# Patient Record
Sex: Female | Born: 1985 | Race: Black or African American | Hispanic: No | Marital: Married | Smoking: Former smoker
Health system: Southern US, Community
[De-identification: ages and names within clinical notes are randomized; demographics above are authoritative.]

## PROBLEM LIST (undated history)

## (undated) DIAGNOSIS — O09299 Supervision of pregnancy with other poor reproductive or obstetric history, unspecified trimester: Secondary | ICD-10-CM

## (undated) DIAGNOSIS — N83209 Unspecified ovarian cyst, unspecified side: Secondary | ICD-10-CM

## (undated) DIAGNOSIS — O99210 Obesity complicating pregnancy, unspecified trimester: Secondary | ICD-10-CM

## (undated) DIAGNOSIS — E119 Type 2 diabetes mellitus without complications: Secondary | ICD-10-CM

## (undated) DIAGNOSIS — T7840XA Allergy, unspecified, initial encounter: Secondary | ICD-10-CM

## (undated) HISTORY — DX: Allergy, unspecified, initial encounter: T78.40XA

## (undated) HISTORY — PX: OTHER SURGICAL HISTORY: SHX169

## (undated) HISTORY — DX: Supervision of pregnancy with other poor reproductive or obstetric history, unspecified trimester: O09.299

---

## 2004-12-31 ENCOUNTER — Emergency Department: Payer: Self-pay | Admitting: Emergency Medicine

## 2005-01-01 ENCOUNTER — Ambulatory Visit: Payer: Self-pay | Admitting: Emergency Medicine

## 2005-02-27 ENCOUNTER — Inpatient Hospital Stay: Payer: Self-pay | Admitting: Unknown Physician Specialty

## 2005-07-17 ENCOUNTER — Emergency Department: Payer: Self-pay | Admitting: Emergency Medicine

## 2005-09-03 HISTORY — PX: OVARIAN CYST SURGERY: SHX726

## 2005-09-28 ENCOUNTER — Ambulatory Visit: Payer: Self-pay | Admitting: Psychiatry

## 2006-02-21 ENCOUNTER — Emergency Department: Payer: Self-pay | Admitting: Emergency Medicine

## 2006-03-13 ENCOUNTER — Ambulatory Visit: Payer: Self-pay | Admitting: Obstetrics & Gynecology

## 2006-03-22 ENCOUNTER — Emergency Department: Payer: Self-pay | Admitting: Emergency Medicine

## 2006-03-22 ENCOUNTER — Other Ambulatory Visit: Payer: Self-pay

## 2006-05-22 ENCOUNTER — Ambulatory Visit: Payer: Self-pay | Admitting: Obstetrics & Gynecology

## 2006-06-03 ENCOUNTER — Ambulatory Visit: Payer: Self-pay | Admitting: Obstetrics & Gynecology

## 2006-06-10 ENCOUNTER — Encounter: Payer: Self-pay | Admitting: Maternal & Fetal Medicine

## 2006-06-17 ENCOUNTER — Encounter: Payer: Self-pay | Admitting: Maternal & Fetal Medicine

## 2006-06-24 ENCOUNTER — Encounter: Payer: Self-pay | Admitting: Maternal & Fetal Medicine

## 2006-07-01 ENCOUNTER — Encounter: Payer: Self-pay | Admitting: Maternal & Fetal Medicine

## 2006-07-04 ENCOUNTER — Ambulatory Visit: Payer: Self-pay | Admitting: Obstetrics & Gynecology

## 2006-07-08 ENCOUNTER — Encounter: Payer: Self-pay | Admitting: Maternal & Fetal Medicine

## 2006-07-15 ENCOUNTER — Encounter: Payer: Self-pay | Admitting: Maternal & Fetal Medicine

## 2006-07-22 ENCOUNTER — Encounter: Payer: Self-pay | Admitting: Maternal & Fetal Medicine

## 2006-07-26 ENCOUNTER — Observation Stay: Payer: Self-pay

## 2006-07-27 ENCOUNTER — Observation Stay: Payer: Self-pay

## 2006-07-29 ENCOUNTER — Observation Stay: Payer: Self-pay

## 2006-07-30 ENCOUNTER — Encounter: Payer: Self-pay | Admitting: Maternal & Fetal Medicine

## 2006-08-02 ENCOUNTER — Inpatient Hospital Stay: Payer: Self-pay | Admitting: Unknown Physician Specialty

## 2006-08-03 ENCOUNTER — Ambulatory Visit: Payer: Self-pay | Admitting: Obstetrics & Gynecology

## 2006-08-04 DIAGNOSIS — O24424 Gestational diabetes mellitus in childbirth, insulin controlled: Secondary | ICD-10-CM

## 2006-09-28 ENCOUNTER — Emergency Department: Payer: Self-pay | Admitting: Emergency Medicine

## 2008-02-03 ENCOUNTER — Ambulatory Visit: Payer: Self-pay

## 2008-02-24 ENCOUNTER — Observation Stay: Payer: Self-pay | Admitting: Unknown Physician Specialty

## 2008-03-03 ENCOUNTER — Ambulatory Visit: Payer: Self-pay

## 2008-03-04 ENCOUNTER — Observation Stay: Payer: Self-pay

## 2008-03-15 ENCOUNTER — Observation Stay: Payer: Self-pay | Admitting: Obstetrics and Gynecology

## 2008-03-16 ENCOUNTER — Observation Stay: Payer: Self-pay | Admitting: Obstetrics & Gynecology

## 2008-03-17 ENCOUNTER — Inpatient Hospital Stay: Payer: Self-pay | Admitting: Obstetrics & Gynecology

## 2009-09-24 ENCOUNTER — Emergency Department: Payer: Self-pay | Admitting: Emergency Medicine

## 2010-07-12 ENCOUNTER — Emergency Department: Payer: Self-pay | Admitting: Emergency Medicine

## 2010-10-20 ENCOUNTER — Emergency Department: Payer: Self-pay | Admitting: Emergency Medicine

## 2010-10-22 ENCOUNTER — Emergency Department: Payer: Self-pay | Admitting: Emergency Medicine

## 2013-06-14 ENCOUNTER — Emergency Department: Payer: Self-pay | Admitting: Emergency Medicine

## 2013-06-14 LAB — CBC
HCT: 37.1 % (ref 35.0–47.0)
HGB: 13.1 g/dL (ref 12.0–16.0)
MCH: 30.8 pg (ref 26.0–34.0)
MCV: 87 fL (ref 80–100)
Platelet: 297 10*3/uL (ref 150–440)
RBC: 4.25 10*6/uL (ref 3.80–5.20)
RDW: 12.9 % (ref 11.5–14.5)
WBC: 7.1 10*3/uL (ref 3.6–11.0)

## 2013-06-14 LAB — BASIC METABOLIC PANEL
Anion Gap: 6 — ABNORMAL LOW (ref 7–16)
BUN: 11 mg/dL (ref 7–18)
Chloride: 105 mmol/L (ref 98–107)
Co2: 28 mmol/L (ref 21–32)
Creatinine: 0.93 mg/dL (ref 0.60–1.30)
EGFR (African American): >60
Glucose: 97 mg/dL (ref 65–99)
Osmolality: 277 (ref 275–301)
Potassium: 3.8 mmol/L (ref 3.5–5.1)
Sodium: 139 mmol/L (ref 136–145)

## 2013-06-14 LAB — MAGNESIUM: Magnesium: 1.7 mg/dL — ABNORMAL LOW

## 2013-06-17 ENCOUNTER — Emergency Department: Payer: Self-pay | Admitting: Emergency Medicine

## 2014-09-03 DIAGNOSIS — O99210 Obesity complicating pregnancy, unspecified trimester: Secondary | ICD-10-CM

## 2014-09-03 HISTORY — DX: Obesity complicating pregnancy, unspecified trimester: O99.210

## 2015-03-25 ENCOUNTER — Emergency Department
Admission: EM | Admit: 2015-03-25 | Discharge: 2015-03-25 | Disposition: A | Discharge status: Home / Self Care | Payer: BLUE CROSS/BLUE SHIELD | Attending: Emergency Medicine | Admitting: Emergency Medicine

## 2015-03-25 ENCOUNTER — Emergency Department: Payer: BLUE CROSS/BLUE SHIELD

## 2015-03-25 DIAGNOSIS — Z3490 Encounter for supervision of normal pregnancy, unspecified, unspecified trimester: Secondary | ICD-10-CM

## 2015-03-25 DIAGNOSIS — Z331 Pregnant state, incidental: Secondary | ICD-10-CM | POA: Insufficient documentation

## 2015-03-25 DIAGNOSIS — R109 Unspecified abdominal pain: Secondary | ICD-10-CM | POA: Insufficient documentation

## 2015-03-25 DIAGNOSIS — M545 Low back pain: Secondary | ICD-10-CM | POA: Diagnosis present

## 2015-03-25 LAB — URINALYSIS COMPLETE WITH MICROSCOPIC (ARMC ONLY)
BILIRUBIN URINE: NEGATIVE
GLUCOSE, UA: NEGATIVE mg/dL
Hgb urine dipstick: NEGATIVE
Ketones, ur: NEGATIVE mg/dL
LEUKOCYTES UA: NEGATIVE
NITRITE: NEGATIVE
PROTEIN: NEGATIVE mg/dL
RBC / HPF: NONE SEEN RBC/hpf (ref 0–5)
Specific Gravity, Urine: 1.023 (ref 1.005–1.030)
pH: 5 (ref 5.0–8.0)

## 2015-03-25 LAB — CBC WITH DIFFERENTIAL/PLATELET
Basophils Absolute: 0 10*3/uL (ref 0–0.1)
Basophils Relative: 0 %
EOS ABS: 0.1 10*3/uL (ref 0–0.7)
Eosinophils Relative: 1 %
HEMATOCRIT: 37.6 % (ref 35.0–47.0)
Hemoglobin: 12.8 g/dL (ref 12.0–16.0)
LYMPHS PCT: 32 %
Lymphs Abs: 2.8 10*3/uL (ref 1.0–3.6)
MCH: 29.8 pg (ref 26.0–34.0)
MCHC: 33.9 g/dL (ref 32.0–36.0)
MCV: 87.9 fL (ref 80.0–100.0)
MONO ABS: 0.5 10*3/uL (ref 0.2–0.9)
Monocytes Relative: 6 %
NEUTROS ABS: 5.2 10*3/uL (ref 1.4–6.5)
NEUTROS PCT: 61 %
PLATELETS: 297 10*3/uL (ref 150–440)
RBC: 4.28 MIL/uL (ref 3.80–5.20)
RDW: 12.7 % (ref 11.5–14.5)
WBC: 8.6 10*3/uL (ref 3.6–11.0)

## 2015-03-25 LAB — POCT PREGNANCY, URINE: Preg Test, Ur: POSITIVE — AB

## 2015-03-25 LAB — HCG, QUANTITATIVE, PREGNANCY: hCG, Beta Chain, Quant, S: 146 m[IU]/mL — ABNORMAL HIGH (ref <5)

## 2015-03-25 LAB — BASIC METABOLIC PANEL
Anion gap: 7 (ref 5–15)
BUN: 13 mg/dL (ref 6–20)
CALCIUM: 9.4 mg/dL (ref 8.9–10.3)
CO2: 23 mmol/L (ref 22–32)
Chloride: 106 mmol/L (ref 101–111)
Creatinine, Ser: 0.9 mg/dL (ref 0.44–1.00)
GFR calc Af Amer: >60 mL/min (ref >60)
GFR calc non Af Amer: >60 mL/min (ref >60)
Glucose, Bld: 183 mg/dL — ABNORMAL HIGH (ref 65–99)
Potassium: 3.9 mmol/L (ref 3.5–5.1)
Sodium: 136 mmol/L (ref 135–145)

## 2015-03-25 NOTE — ED Notes (Signed)
Pt c/o lower back pain since Monday, denies injury.. 

## 2015-03-25 NOTE — ED Provider Notes (Signed)
[facility] Emergency Department Provider Note  ____________________________________________  Time seen:  11:24 AM  I have reviewed the triage vital signs and the nursing notes.   HISTORY  Chief Complaint Back Pain   HPI Kari Tanner is a 29 y.o. female is here with complaint of low back pain for approximately 2-3 days. She states now the pain is radiating around to her left lower abdomen. She denies any urinary symptoms. She does not have a history of kidney stones. She denies any injury. Nothing has helped her symptoms and movement has made it worse. She also states that she does not use birth control and last period was in June. She denies any vaginal discharge or vaginal bleeding at this time. Currently pain 8 out of 10. She is continue to ambulate but with pain. She denies any previous back problems.   No past medical history on file.  There are no active problems to display for this patient.   No past surgical history on file.  No current outpatient prescriptions on file.  Allergies Review of patient's allergies indicates no known allergies.  No family history on file.  Social History History  Substance Use Topics  . Smoking status: Not on file  . Smokeless tobacco: Not on file  . Alcohol Use: Not on file    Review of Systems Constitutional: No fever/chills Eyes: No visual changes. ENT: No sore throat. Cardiovascular: Denies chest pain. Respiratory: Denies shortness of breath. Gastrointestinal positive left abdominal pain.  No nausea, no vomiting.  No diarrhea.  No constipation. Genitourinary: Negative for dysuria. Musculoskeletal: Positive for back pain. Skin: Negative for rash. Neurological: Negative for headaches, focal weakness or numbness.  10-point ROS otherwise negative.  ____________________________________________   PHYSICAL EXAM:  VITAL SIGNS: ED Triage Vitals  Enc Vitals Group     BP 03/25/15 1100 127/81  mmHg     Pulse Rate 03/25/15 1100 86     Resp 03/25/15 1100 18     Temp 03/25/15 1100 98.6 F (37 C)     Temp Source 03/25/15 1100 Oral     SpO2 03/25/15 1100 99 %     Weight 03/25/15 1059 236 lb (107.049 kg)     Height 03/25/15 1059  (1.6 m)     Head Cir --      Peak Flow --      Pain Score 03/25/15 1059 8     Pain Loc --      Pain Edu? --      Excl. in GC? --     Constitutional: Alert and oriented. Well appearing and in no acute distress. Eyes: Conjunctivae are normal. PERRL. EOMI. Head: Atraumatic. Nose: No congestion/rhinnorhea. Neck: No stridor.   Cardiovascular: Normal rate, regular rhythm. Grossly normal heart sounds.  Good peripheral circulation. Respiratory: Normal respiratory effort.  No retractions. Lungs CTAB. Gastrointestinal: Soft, with left lower quadrant tenderness. Bowel sounds are present in 4 quadrants. No rebound tenderness noted. No distention. No abdominal bruits. No CVA tenderness. Musculoskeletal: Back exam no gross deformity was noted. Range of motion is restricted secondary to pain. There is tenderness on palpation of the left paravertebral muscles. No lower extremity tenderness nor edema.  No joint effusions. Neurologic:  Normal speech and language. No gross focal neurologic deficits are appreciated. No gait instability. Skin:  Skin is warm, dry and intact. No rash noted. Psychiatric: Mood and affect are normal. Speech and behavior are normal.  ____________________________________________   LABS (all labs ordered are listed,  but only abnormal results are displayed)  Labs Reviewed  URINALYSIS COMPLETEWITH MICROSCOPIC ([facility] ONLY) - Abnormal; Notable for the following:    Color, Urine YELLOW (*)    APPearance CLEAR (*)    Bacteria, UA RARE (*)    Squamous Epithelial / LPF 0-5 (*)    All other components within normal limits  BASIC METABOLIC PANEL - Abnormal; Notable for the following:    Glucose, Bld 183 (*)    All other components within  normal limits  HCG, QUANTITATIVE, PREGNANCY - Abnormal; Notable for the following:    hCG, Beta Chain, Quant, S 146 (*)    All other components within normal limits  POCT PREGNANCY, URINE - Abnormal; Notable for the following:    Preg Test, Ur POSITIVE (*)    All other components within normal limits  CBC WITH DIFFERENTIAL/PLATELET  CBC WITH DIFFERENTIAL/PLATELET  POC URINE PREG, ED      _RADIOLOGY  Vaginal ultrasound shows no intrauterine fluid collection or gestational sac per radiologist. No ectopic pregnancy seen ____________________________________________   PROCEDURES  Procedure(s) performed: None  Critical Care performed: No  ____________________________________________   INITIAL IMPRESSION / ASSESSMENT AND PLAN / ED COURSE  Pertinent labs & imaging results that were available during my care of the patient were reviewed by me and considered in my medical decision making (see chart for details).  Explained to both patient and mother that she will need repeat beta hCG in 3 days. She was given the name of the OB/GYN doctor on call. She is return to the emergency room if any severe worsening or urgent concerns. She also is to discontinue using ibuprofen. She may use ice or heat to her back. ____________________________________________   FINAL CLINICAL IMPRESSION(S) / ED DIAGNOSES  Final diagnoses:  Left sided abdominal pain  Pregnancy      Tommi Rumps, PA-C 03/25/15 1538  Tommi Rumps, PA-C 03/25/15 1544  Phineas Semen, MD 03/25/15 1553

## 2015-03-25 NOTE — Discharge Instructions (Signed)
CALL THE OFFICE OF THE OB/GYN THAT IS LISTED ON YOUR PAPERS FOR INSTRUCTIONS FOR BLOOD WORK ON Monday RETURN TO ER THIS WEEKEND IF ANY SEVERE WORSENING OF YOUR PAIN OR URGENT CONCERNS

## 2015-03-25 NOTE — ED Notes (Signed)
States she developed lower back pain 2-3 days ago pain is now in lower abd . Denies any injury or urinary sx's

## 2015-04-05 ENCOUNTER — Ambulatory Visit (INDEPENDENT_AMBULATORY_CARE_PROVIDER_SITE_OTHER): Payer: Medicaid Other | Admitting: Obstetrics and Gynecology

## 2015-04-05 VITALS — BP 123/77 | HR 89 | Wt 236.1 lb

## 2015-04-05 DIAGNOSIS — N926 Irregular menstruation, unspecified: Secondary | ICD-10-CM

## 2015-04-05 LAB — POCT URINE PREGNANCY: Preg Test, Ur: POSITIVE — AB

## 2015-04-05 NOTE — Progress Notes (Cosign Needed)
Pt is here for follow up from ER 03/25/2015, [place] was done no gestational sac was noted, correlation with beta HCG levels and follow up US was recommended in 7-10 days Pt denies any vaginal bleeding, discussed with pt about repeating lab and [place] for viability was set up for 04/08/2015, pt voiced understanding

## 2015-04-07 LAB — BETA HCG QUANT (REF LAB): hCG Quant: 9004 m[IU]/mL

## 2015-04-08 ENCOUNTER — Other Ambulatory Visit: Payer: Self-pay | Admitting: *Deleted

## 2015-04-08 ENCOUNTER — Ambulatory Visit: Payer: BLUE CROSS/BLUE SHIELD

## 2015-04-08 DIAGNOSIS — N926 Irregular menstruation, unspecified: Secondary | ICD-10-CM | POA: Diagnosis not present

## 2015-04-08 LAB — SPECIMEN STATUS REPORT

## 2015-04-08 LAB — BETA HCG QUANT (REF LAB): hCG Quant: 9122 m[IU]/mL

## 2015-04-14 ENCOUNTER — Other Ambulatory Visit: Payer: Self-pay | Admitting: *Deleted

## 2015-04-14 ENCOUNTER — Telehealth: Payer: Self-pay | Admitting: Obstetrics and Gynecology

## 2015-04-14 MED ORDER — ONDANSETRON 4 MG PO TBDP: 4.0000 mg | ORAL_TABLET | Freq: Three times a day (TID) | ORAL | Status: DC | PRN

## 2015-04-14 NOTE — Telephone Encounter (Signed)
Notified pt to call if needs medication sent to pharmacy

## 2015-04-14 NOTE — Telephone Encounter (Signed)
Rx sent to pharmacy   

## 2015-04-14 NOTE — Telephone Encounter (Signed)
Patient called. She wants to know if she can give herself a shot of zofran for nausea. She is [redacted] weeks pregnant.

## 2015-04-14 NOTE — Telephone Encounter (Signed)
Pt wants the RX sent to pharmacy  [facility]

## 2015-04-14 NOTE — Telephone Encounter (Signed)
pls advise

## 2015-04-14 NOTE — Telephone Encounter (Signed)
That is safe, does she need a prescription for the ODT zofran to take at home?

## 2015-04-21 ENCOUNTER — Other Ambulatory Visit: Payer: Self-pay | Admitting: Obstetrics and Gynecology

## 2015-04-21 DIAGNOSIS — N926 Irregular menstruation, unspecified: Secondary | ICD-10-CM

## 2015-04-22 ENCOUNTER — Ambulatory Visit (INDEPENDENT_AMBULATORY_CARE_PROVIDER_SITE_OTHER): Payer: BLUE CROSS/BLUE SHIELD | Admitting: Obstetrics and Gynecology

## 2015-04-22 ENCOUNTER — Ambulatory Visit: Payer: BLUE CROSS/BLUE SHIELD

## 2015-04-22 ENCOUNTER — Encounter: Payer: Self-pay | Admitting: Obstetrics and Gynecology

## 2015-04-22 VITALS — BP 128/84 | Wt 245.5 lb

## 2015-04-22 DIAGNOSIS — N926 Irregular menstruation, unspecified: Secondary | ICD-10-CM | POA: Diagnosis not present

## 2015-04-22 DIAGNOSIS — E663 Overweight: Secondary | ICD-10-CM

## 2015-04-22 DIAGNOSIS — Z9889 Other specified postprocedural states: Secondary | ICD-10-CM

## 2015-04-22 DIAGNOSIS — Z98891 History of uterine scar from previous surgery: Secondary | ICD-10-CM

## 2015-04-22 DIAGNOSIS — Z3491 Encounter for supervision of normal pregnancy, unspecified, first trimester: Secondary | ICD-10-CM

## 2015-04-22 NOTE — Progress Notes (Cosign Needed)
NOB nurse intake, pt had viability done, pt was given OB packet, we discussed the [place] virus as [place] been [place] of the [place].  We discussed the 1st trimester screening info and she was given pamphlet on Panaroma, advised [facility] would discuss at her next office visit

## 2015-04-22 NOTE — Progress Notes (Signed)
Indications:repeat dating and viability Findings:  Singleton intrauterine pregnancy is visualized with a CRL consistent with 8 1/[redacted] weeks gestation, giving an (U/S) EDD of 12/01/2015. The (U/S) EDD is consistent with the clinically established (LMP) EDD of 12/03/2015.  FHR: 168 bpm CRL measurement: 17.2 mm Yolk sac and and early anatomy is normal.  Right Ovary measures 3.0 x 2.2 x 2.5 cm. It is normal in appearance. Left Ovary measures 4.6 x 3.2 x 3.3 cm. It is normal appearance. There is evidence of a corpus luteal cyst in the Left Survey of the adnexa demonstrates no adnexal masses. There is no free peritoneal fluid in the cul de sac.  Impression: 1. 8 1/7 week Viable Singleton Intrauterine pregnancy by U/S. 2. (U/S) EDD is consistent with Clinically established (LMP) EDD of 12/03/2015.

## 2015-04-23 LAB — URINALYSIS, ROUTINE W REFLEX MICROSCOPIC
Bilirubin, UA: NEGATIVE
Leukocytes, UA: NEGATIVE
NITRITE UA: NEGATIVE
Protein, UA: NEGATIVE
RBC, UA: NEGATIVE
SPEC GRAV UA: 1.03 (ref 1.005–1.030)
UUROB: 1 mg/dL (ref 0.2–1.0)
pH, UA: 6.5 (ref 5.0–7.5)

## 2015-04-23 LAB — CBC WITH DIFFERENTIAL/PLATELET
BASOS ABS: 0 10*3/uL (ref 0.0–0.2)
Basos: 0 %
EOS (ABSOLUTE): 0.1 10*3/uL (ref 0.0–0.4)
Eos: 1 %
Hematocrit: 36.5 % (ref 34.0–46.6)
Hemoglobin: 12 g/dL (ref 11.1–15.9)
IMMATURE GRANS (ABS): 0 10*3/uL (ref 0.0–0.1)
IMMATURE GRANULOCYTES: 0 %
LYMPHS: 31 %
Lymphocytes Absolute: 3.2 10*3/uL — ABNORMAL HIGH (ref 0.7–3.1)
MCH: 29.1 pg (ref 26.6–33.0)
MCHC: 32.9 g/dL (ref 31.5–35.7)
MCV: 89 fL (ref 79–97)
Monocytes Absolute: 0.7 10*3/uL (ref 0.1–0.9)
Monocytes: 7 %
NEUTROS PCT: 61 %
Neutrophils Absolute: 6.3 10*3/uL (ref 1.4–7.0)
PLATELETS: 315 10*3/uL (ref 150–379)
RBC: 4.12 x10E6/uL (ref 3.77–5.28)
RDW: 13.8 % (ref 12.3–15.4)
WBC: 10.3 10*3/uL (ref 3.4–10.8)

## 2015-04-23 LAB — TSH: TSH: 1.3 u[IU]/mL (ref 0.450–4.500)

## 2015-04-23 LAB — ABO AND RH: Rh Factor: POSITIVE

## 2015-04-23 LAB — RUBELLA SCREEN: Rubella Antibodies, IGG: 1.77 index (ref >0.99)

## 2015-04-23 LAB — ANTIBODY SCREEN: ANTIBODY SCREEN: NEGATIVE

## 2015-04-23 LAB — HEP, RPR, HIV PANEL
HEP B S AG: NEGATIVE
HIV Screen 4th Generation wRfx: NONREACTIVE
RPR Ser Ql: NONREACTIVE

## 2015-04-23 LAB — VARICELLA ZOSTER ANTIBODY, IGG: Varicella zoster IgG: 186 index (ref >165)

## 2015-04-24 LAB — URINE CULTURE

## 2015-04-24 LAB — GC/CHLAMYDIA PROBE AMP
Chlamydia trachomatis, NAA: NEGATIVE
Neisseria gonorrhoeae by PCR: NEGATIVE

## 2015-04-25 LAB — SICKLE CELL SCREEN: SICKLE CELL SCREEN: NEGATIVE

## 2015-04-25 LAB — HEMOGLOBIN A1C
Est. average glucose Bld gHb Est-mCnc: 131 mg/dL
Hgb A1c MFr Bld: 6.2 % — ABNORMAL HIGH (ref 4.8–5.6)

## 2015-04-26 ENCOUNTER — Other Ambulatory Visit: Payer: Self-pay | Admitting: Obstetrics and Gynecology

## 2015-04-26 DIAGNOSIS — O99210 Obesity complicating pregnancy, unspecified trimester: Secondary | ICD-10-CM

## 2015-04-28 LAB — SPECIMEN STATUS REPORT

## 2015-05-02 ENCOUNTER — Encounter: Payer: Self-pay | Admitting: Obstetrics and Gynecology

## 2015-05-07 ENCOUNTER — Emergency Department
Admission: EM | Admit: 2015-05-07 | Discharge: 2015-05-07 | Disposition: A | Discharge status: Home / Self Care | Payer: BLUE CROSS/BLUE SHIELD | Attending: Emergency Medicine | Admitting: Emergency Medicine

## 2015-05-07 ENCOUNTER — Encounter: Payer: Self-pay | Admitting: Emergency Medicine

## 2015-05-07 ENCOUNTER — Emergency Department: Payer: BLUE CROSS/BLUE SHIELD

## 2015-05-07 DIAGNOSIS — O9989 Other specified diseases and conditions complicating pregnancy, childbirth and the puerperium: Secondary | ICD-10-CM | POA: Insufficient documentation

## 2015-05-07 DIAGNOSIS — Z3A11 11 weeks gestation of pregnancy: Secondary | ICD-10-CM | POA: Diagnosis not present

## 2015-05-07 DIAGNOSIS — R1030 Lower abdominal pain, unspecified: Secondary | ICD-10-CM | POA: Insufficient documentation

## 2015-05-07 DIAGNOSIS — N898 Other specified noninflammatory disorders of vagina: Secondary | ICD-10-CM | POA: Insufficient documentation

## 2015-05-07 DIAGNOSIS — Z3490 Encounter for supervision of normal pregnancy, unspecified, unspecified trimester: Secondary | ICD-10-CM

## 2015-05-07 HISTORY — DX: Unspecified ovarian cyst, unspecified side: N83.209

## 2015-05-07 LAB — BASIC METABOLIC PANEL
Anion gap: 8 (ref 5–15)
BUN: 11 mg/dL (ref 6–20)
CO2: 24 mmol/L (ref 22–32)
Calcium: 9.4 mg/dL (ref 8.9–10.3)
Chloride: 101 mmol/L (ref 101–111)
Creatinine, Ser: 0.71 mg/dL (ref 0.44–1.00)
GFR calc Af Amer: >60 mL/min (ref >60)
GFR calc non Af Amer: >60 mL/min (ref >60)
Glucose, Bld: 219 mg/dL — ABNORMAL HIGH (ref 65–99)
Potassium: 3.6 mmol/L (ref 3.5–5.1)
Sodium: 133 mmol/L — ABNORMAL LOW (ref 135–145)

## 2015-05-07 LAB — CBC WITH DIFFERENTIAL/PLATELET
Basophils Absolute: 0 10*3/uL (ref 0–0.1)
Basophils Relative: 1 %
Eosinophils Absolute: 0 10*3/uL (ref 0–0.7)
Eosinophils Relative: 1 %
HCT: 36.4 % (ref 35.0–47.0)
Hemoglobin: 12.5 g/dL (ref 12.0–16.0)
Lymphocytes Relative: 25 %
Lymphs Abs: 2.2 10*3/uL (ref 1.0–3.6)
MCH: 30.5 pg (ref 26.0–34.0)
MCHC: 34.2 g/dL (ref 32.0–36.0)
MCV: 89.1 fL (ref 80.0–100.0)
Monocytes Absolute: 0.6 10*3/uL (ref 0.2–0.9)
Monocytes Relative: 6 %
Neutro Abs: 6.1 10*3/uL (ref 1.4–6.5)
Neutrophils Relative %: 67 %
Platelets: 284 10*3/uL (ref 150–440)
RBC: 4.09 MIL/uL (ref 3.80–5.20)
RDW: 13.3 % (ref 11.5–14.5)
WBC: 9 10*3/uL (ref 3.6–11.0)

## 2015-05-07 LAB — HCG, QUANTITATIVE, PREGNANCY: hCG, Beta Chain, Quant, S: 101806 m[IU]/mL — ABNORMAL HIGH (ref <5)

## 2015-05-07 NOTE — Discharge Instructions (Signed)
First Trimester of Pregnancy °The first trimester of pregnancy is from week 1 until the end of week 12 (months 1 through 3). A week after a sperm fertilizes an egg, the egg will implant on the wall of the uterus. This embryo will begin to develop into a baby. Genes from you and your partner are forming the baby. The female genes determine whether the baby is a boy or a girl. At 6-8 weeks, the eyes and face are formed, and the heartbeat can be seen on ultrasound. At the end of 12 weeks, all the baby's organs are formed.  °Now that you are pregnant, you will want to do everything you can to have a healthy baby. Two of the most important things are to get good prenatal care and to follow your health care provider's instructions. Prenatal care is all the medical care you receive before the baby's birth. This care will help prevent, find, and treat any problems during the pregnancy and childbirth. °BODY CHANGES °Your body goes through many changes during pregnancy. The changes vary from woman to woman.  °· You may gain or lose a couple of pounds at first. °· You may feel sick to your stomach (nauseous) and throw up (vomit). If the vomiting is uncontrollable, call your health care provider. °· You may tire easily. °· You may develop headaches that can be relieved by medicines approved by your health care provider. °· You may urinate more often. Painful urination may mean you have a bladder infection. °· You may develop heartburn as a result of your pregnancy. °· You may develop constipation because certain hormones are causing the muscles that push waste through your intestines to slow down. °· You may develop hemorrhoids or swollen, bulging veins (varicose veins). °· Your breasts may begin to grow larger and become tender. Your nipples may stick out more, and the tissue that surrounds them (areola) may become darker. °· Your gums may bleed and may be sensitive to brushing and flossing. °· Dark spots or blotches (chloasma,  mask of pregnancy) may develop on your face. This will likely fade after the baby is born. °· Your menstrual periods will stop. °· You may have a loss of appetite. °· You may develop cravings for certain kinds of food. °· You may have changes in your emotions from day to day, such as being excited to be pregnant or being concerned that something may go wrong with the pregnancy and baby. °· You may have more vivid and strange dreams. °· You may have changes in your hair. These can include thickening of your hair, rapid growth, and changes in texture. Some women also have hair loss during or after pregnancy, or hair that feels dry or thin. Your hair will most likely return to normal after your baby is born. °WHAT TO EXPECT AT YOUR PRENATAL VISITS °During a routine prenatal visit: °· You will be weighed to make sure you and the baby are growing normally. °· Your blood pressure will be taken. °· Your abdomen will be measured to track your baby's growth. °· The fetal heartbeat will be listened to starting around week 10 or 12 of your pregnancy. °· Test results from any previous visits will be discussed. °Your health care provider may ask you: °· How you are feeling. °· If you are feeling the baby move. °· If you have had any abnormal symptoms, such as leaking fluid, bleeding, severe headaches, or abdominal cramping. °· If you have any questions. °Other tests   that may be performed during your first trimester include: °· Blood tests to find your blood type and to check for the presence of any previous infections. They will also be used to check for low iron levels (anemia) and Rh antibodies. Later in the pregnancy, blood tests for diabetes will be done along with other tests if problems develop. °· Urine tests to check for infections, diabetes, or protein in the urine. °· An ultrasound to confirm the proper growth and development of the baby. °· An amniocentesis to check for possible genetic problems. °· Fetal screens for  spina bifida and Down syndrome. °· You may need other tests to make sure you and the baby are doing well. °HOME CARE INSTRUCTIONS  °Medicines °· Follow your health care provider's instructions regarding medicine use. Specific medicines may be either safe or unsafe to take during pregnancy. °· Take your prenatal vitamins as directed. °· If you develop constipation, try taking a stool softener if your health care provider approves. °Diet °· Eat regular, well-balanced meals. Choose a variety of foods, such as meat or vegetable-based protein, fish, milk and low-fat dairy products, vegetables, fruits, and whole grain breads and cereals. Your health care provider will help you determine the amount of weight gain that is right for you. °· Avoid raw meat and uncooked cheese. These carry germs that can cause birth defects in the baby. °· Eating four or five small meals rather than three large meals a day may help relieve nausea and vomiting. If you start to feel nauseous, eating a few soda crackers can be helpful. Drinking liquids between meals instead of during meals also seems to help nausea and vomiting. °· If you develop constipation, eat more high-fiber foods, such as fresh vegetables or fruit and whole grains. Drink enough fluids to keep your urine clear or pale yellow. °Activity and Exercise °· Exercise only as directed by your health care provider. Exercising will help you: °¨ Control your weight. °¨ Stay in shape. °¨ Be prepared for labor and delivery. °· Experiencing pain or cramping in the lower abdomen or low back is a good sign that you should stop exercising. Check with your health care provider before continuing normal exercises. °· Try to avoid standing for long periods of time. Move your legs often if you must stand in one place for a long time. °· Avoid heavy lifting. °· Wear low-heeled shoes, and practice good posture. °· You may continue to have sex unless your health care provider directs you  otherwise. °Relief of Pain or Discomfort °· Wear a good support bra for breast tenderness.   °· Take warm sitz baths to soothe any pain or discomfort caused by hemorrhoids. Use hemorrhoid cream if your health care provider approves.   °· Rest with your legs elevated if you have leg cramps or low back pain. °· If you develop varicose veins in your legs, wear support hose. Elevate your feet for 15 minutes, 3-4 times a day. Limit salt in your diet. °Prenatal Care °· Schedule your prenatal visits by the twelfth week of pregnancy. They are usually scheduled monthly at first, then more often in the last 2 months before delivery. °· Write down your questions. Take them to your prenatal visits. °· Keep all your prenatal visits as directed by your health care provider. °Safety °· Wear your seat belt at all times when driving. °· Make a list of emergency phone numbers, including numbers for family, friends, the hospital, and police and fire departments. °General Tips °·   Ask your health care provider for a referral to a local prenatal education class. Begin classes no later than at the beginning of month 6 of your pregnancy.  Ask for help if you have counseling or nutritional needs during pregnancy. Your health care provider can offer advice or refer you to specialists for help with various needs.  Do not use hot tubs, steam rooms, or saunas.  Do not douche or use tampons or scented sanitary pads.  Do not cross your legs for long periods of time.  Avoid cat litter boxes and soil used by cats. These carry germs that can cause birth defects in the baby and possibly loss of the fetus by miscarriage or stillbirth.  Avoid all smoking, herbs, alcohol, and medicines not prescribed by your health care provider. Chemicals in these affect the formation and growth of the baby.  Schedule a dentist appointment. At home, brush your teeth with a soft toothbrush and be gentle when you floss. SEEK MEDICAL CARE IF:   You have  dizziness.  You have mild pelvic cramps, pelvic pressure, or nagging pain in the abdominal area.  You have persistent nausea, vomiting, or diarrhea.  You have a bad smelling vaginal discharge.  You have pain with urination.  You notice increased swelling in your face, hands, legs, or ankles. SEEK IMMEDIATE MEDICAL CARE IF:   You have a fever.  You are leaking fluid from your vagina.  You have spotting or bleeding from your vagina.  You have severe abdominal cramping or pain.  You have rapid weight gain or loss.  You vomit blood or material that looks like coffee grounds.  You are exposed to [place] measles and have never had them.  You are exposed to fifth disease or chickenpox.  You develop a severe headache.  You have shortness of breath.  You have any kind of trauma, such as from a fall or a car accident. Document Released: 08/14/2001 Document Revised: 01/04/2014 Document Reviewed: 06/30/2013 [facility] Patient Information 2015 [place], [place]. This information is not intended to replace advice given to you by your health care provider. Make sure you discuss any questions you have with your health care provider.   Please return immediately if condition worsens. Please contact her primary physician or the physician you were given for referral. If you have any specialist physicians involved in her treatment and plan please also contact them. Thank you for using  regional emergency Department.

## 2015-05-07 NOTE — ED Provider Notes (Signed)
Time Seen: Approximately ----------------------------------------- 11:10 AM on 05/07/2015 -----------------------------------------   I have reviewed the triage notes  Chief Complaint: Abdominal Cramping   History of Present Illness: Kari Tanner is a 29 y.o. female who is currently gravida 6 para 3 with 2 previous miscarriages. Patient's currently approximately [redacted] weeks pregnant. She is concerned today and came to the emergency department because she was having some lower abdominal cramping and some vaginal discharge that usually what she sees prior to her menstrual cycle. He describes the discharge is dark and brown in appearance. She denies any yellow discharge she denies any fever at home. She has not felt fetal movements at this time. She's had a previous ultrasound approximately 8-1/[redacted] weeks pregnant which was normal. Past Medical History  Diagnosis Date  . History of miscarriage, currently pregnant   . Ovarian cyst     There are no active problems to display for this patient.   Past Surgical History  Procedure Laterality Date  . C sections      Past Surgical History  Procedure Laterality Date  . C sections      Current Outpatient Rx  Name  Route  Sig  Dispense  Refill  . ondansetron (ZOFRAN ODT) 4 MG disintegrating tablet   Oral   Take 1 tablet (4 mg total) by mouth every 8 (eight) hours as needed for nausea or vomiting. Patient not taking: Reported on 04/22/2015   20 tablet   3   . Prenatal Vit-Fe Fumarate-FA (PRENATAL MULTIVITAMIN) TABS tablet   Oral   Take 1 tablet by mouth daily at 12 noon.           Allergies:  Review of patient's allergies indicates no known allergies.  Family History: Family History  Problem Relation Age of Onset  . Diabetes Mother   . Hypertension Mother   . Hypertension Paternal Grandmother     Social History: Social History  Substance Use Topics  . Smoking status: Never Smoker   . Smokeless tobacco: Never Used   . Alcohol Use: No     Review of Systems:   10 point review of systems was performed and was otherwise negative:  Constitutional: No fever Eyes: No visual disturbances ENT: No sore throat, ear pain Cardiac: No chest pain Respiratory: No shortness of breath, wheezing, or stridor Abdomen: No abdominal pain, no vomiting, No diarrhea Endocrine: No weight loss, No night sweats Extremities: No peripheral edema, cyanosis Skin: No rashes, easy bruising Neurologic: No focal weakness, trouble with speech or swollowing Urologic: No dysuria, Hematuria, or urinary frequency   Physical Exam:  ED Triage Vitals  Enc Vitals Group     BP 05/07/15 1040 128/71 mmHg     Pulse Rate 05/07/15 1040 110     Resp 05/07/15 1040 18     Temp 05/07/15 1040 98.5 F (36.9 C)     Temp Source 05/07/15 1040 Oral     SpO2 05/07/15 1040 97 %     Weight 05/07/15 1040 238 lb (107.956 kg)     Height 05/07/15 1040 5\' 2"  (1.575 m)     Head Cir --      Peak Flow --      Pain Score 05/07/15 1045 5     Pain Loc --      Pain Edu? --      Excl. in GC? --     General: Awake , Alert , and Oriented times 3; GCS 15 Head: Normal cephalic , atraumatic Eyes: Pupils equal ,  round, reactive to light Nose/Throat: No nasal drainage, patent upper airway without erythema or exudate.  Neck: Supple, Full range of motion, No anterior adenopathy or palpable thyroid masses Lungs: Clear to ascultation without wheezes , rhonchi, or rales Heart: Regular rate, regular rhythm without murmurs , gallops , or rubs Abdomen: Soft, non tender without rebound, guarding , or rigidity; bowel sounds positive and symmetric in all 4 quadrants. No organomegaly .        Extremities: 2 plus symmetric pulses. No edema, clubbing or cyanosis Neurologic: normal ambulation, Motor symmetric without deficits, sensory intact Skin: warm, dry, no rashes   Labs:   All laboratory work was reviewed including any pertinent negatives or positives listed  below:  Labs Reviewed  BASIC METABOLIC PANEL  CBC WITH DIFFERENTIAL/PLATELET  HCG, QUANTITATIVE, PREGNANCY   Serum quantitative level was 101,806; otherwise laboratory work was within normal limits.    Radiology   EXAM: OBSTETRIC <14 WK ULTRASOUND  TECHNIQUE: Transabdominal ultrasound was performed for evaluation of the gestation as well as the maternal uterus and adnexal regions.  COMPARISON: 04/22/2015  FINDINGS: Intrauterine gestational sac: Visualized/normal in shape.  Yolk sac: Not visualized  Embryo: Visualized  Cardiac Activity: Visualized  Heart Rate: 169 bpm  CRL:  38.8 mm  10 w 5d         [place] EDC: 11/28/2015  Maternal uterus/adnexae: There is no evidence of subchorionic hemorrhage.  The ovaries bilaterally are unremarkable. There is no evidence of free fluid or adnexal mass.  IMPRESSION: Single living intrauterine gestation with assigned gestational age of [redacted] weeks 0 days. No evidence of subchorionic hemorrhage.    I personally reviewed the radiologic studies      ED Course:   Patient's stay here was uneventful. Patient wanted confirmation of the status of her pregnancy and appears to be a normally progressive first trimester pregnancy at this time. Patient was referred to her OB/GYN for further outpatient management. She'll return here for any persistent vaginal bleeding, heavy vaginal bleeding of more than 1 full pad per hour for 4 consecutive hours, or any other new concerns    Assessment:  First Trimester Pregnancy  Final Clinical Impression:  First trimester pregnancy Final diagnoses:  None     Plan: Patient was advised to return immediately if condition worsens. Patient was advised to follow up with her primary care physician or other specialized physicians involved and in their current assessment.            Jennye Moccasin, MD 05/07/15 1440

## 2015-05-07 NOTE — ED Notes (Signed)
Reports being 10 wks pregnat.  Last night noticed brown dc,today having cramping.

## 2015-05-10 ENCOUNTER — Telehealth: Payer: Self-pay | Admitting: Obstetrics and Gynecology

## 2015-05-10 NOTE — Telephone Encounter (Signed)
[redacted] WEEKS PREGNANT..WAS SSEEN IN THE ER WITH CRAMPING AND LIGHT BROWN DISCHARGE, THEY DID [place] AND EVERYTING WAS FINE, THEY TOLD HER TO BE ON BED REST BUT DIDN'T TELL HER HOW LONG, SHE WANTED TO KNOW DOES SHE NEED TO COME IN AND BE SEEN OR DO YOU KNOW MAYBE HOW LONG TO BE ON BED REST FOR.

## 2015-05-10 NOTE — Telephone Encounter (Signed)
Called pt and she is still seeing some light spotting, letter sent to employer for light duty for remainder of the week

## 2015-05-10 NOTE — Telephone Encounter (Signed)
We usually recommend pelvic rest for 3 days from last seen spotting and light activity (not bedrest) for a week.  Please have her let us know if bleeding persist.

## 2015-05-12 ENCOUNTER — Telehealth: Payer: Self-pay | Admitting: Obstetrics and Gynecology

## 2015-05-12 NOTE — Telephone Encounter (Signed)
Faxed letter to [phone] with desk duty restritctions

## 2015-05-12 NOTE — Telephone Encounter (Signed)
PT CALLED AND SHE STATED SHE WAS IN THE ER THIS PAST WEEKEND, AND SHE STATED THAT SHE HAS ALREADY TALK TO YOU ABOUT THE ER ROOM VISIT, SHE NEEDS A NOTE FROM [place] STATING THAT SHE CAN GO BACK TO WORK FULL STARTING TODAY 05/12/15, WITH DESK DUTY ONLY FOR RESTRICTIONS, FAX TO [phone]

## 2015-05-20 ENCOUNTER — Other Ambulatory Visit: Payer: Self-pay | Admitting: Obstetrics and Gynecology

## 2015-05-20 ENCOUNTER — Other Ambulatory Visit: Payer: BLUE CROSS/BLUE SHIELD

## 2015-05-20 ENCOUNTER — Encounter: Payer: Self-pay | Admitting: Obstetrics and Gynecology

## 2015-05-20 ENCOUNTER — Ambulatory Visit (INDEPENDENT_AMBULATORY_CARE_PROVIDER_SITE_OTHER): Payer: BLUE CROSS/BLUE SHIELD | Admitting: Obstetrics and Gynecology

## 2015-05-20 VITALS — BP 132/80 | HR 102 | Wt 248.8 lb

## 2015-05-20 DIAGNOSIS — Z3491 Encounter for supervision of normal pregnancy, unspecified, first trimester: Secondary | ICD-10-CM

## 2015-05-20 LAB — POCT URINALYSIS DIPSTICK
BILIRUBIN UA: NEGATIVE
GLUCOSE UA: 500
KETONES UA: 5
Leukocytes, UA: NEGATIVE
Nitrite, UA: NEGATIVE
Protein, UA: NEGATIVE
SPEC GRAV UA: 1.015
UROBILINOGEN UA: 0.2
pH, UA: 5

## 2015-05-20 NOTE — Patient Instructions (Signed)
Second Trimester of Pregnancy The second trimester is from week 13 through week 28, months 4 through 6. The second trimester is often a time when you feel your best. Your body has also adjusted to being pregnant, and you begin to feel better physically. Usually, morning sickness has lessened or quit completely, you may have more energy, and you may have an increase in appetite. The second trimester is also a time when the fetus is growing rapidly. At the end of the sixth month, the fetus is about 9 inches long and weighs about 1 pounds. You will likely begin to feel the baby move (quickening) between 18 and 20 weeks of the pregnancy. BODY CHANGES Your body goes through many changes during pregnancy. The changes vary from woman to woman.   Your weight will continue to increase. You will notice your lower abdomen bulging out.  You may begin to get stretch marks on your hips, abdomen, and breasts.  You may develop headaches that can be relieved by medicines approved by your health care provider.  You may urinate more often because the fetus is pressing on your bladder.  You may develop or continue to have heartburn as a result of your pregnancy.  You may develop constipation because certain hormones are causing the muscles that push waste through your intestines to slow down.  You may develop hemorrhoids or swollen, bulging veins (varicose veins).  You may have back pain because of the weight gain and pregnancy hormones relaxing your joints between the bones in your pelvis and as a result of a shift in weight and the muscles that support your balance.  Your breasts will continue to grow and be tender.  Your gums may bleed and may be sensitive to brushing and flossing.  Dark spots or blotches (chloasma, mask of pregnancy) may develop on your face. This will likely fade after the baby is born.  A dark line from your belly button to the pubic area (linea nigra) may appear. This will likely fade  after the baby is born.  You may have changes in your hair. These can include thickening of your hair, rapid growth, and changes in texture. Some women also have hair loss during or after pregnancy, or hair that feels dry or thin. Your hair will most likely return to normal after your baby is born. WHAT TO EXPECT AT YOUR PRENATAL VISITS During a routine prenatal visit:  You will be weighed to make sure you and the fetus are growing normally.  Your blood pressure will be taken.  Your abdomen will be measured to track your baby's growth.  The fetal heartbeat will be listened to.  Any test results from the previous visit will be discussed. Your health care provider may ask you:  How you are feeling.  If you are feeling the baby move.  If you have had any abnormal symptoms, such as leaking fluid, bleeding, severe headaches, or abdominal cramping.  If you have any questions. Other tests that may be performed during your second trimester include:  Blood tests that check for:  Low iron levels (anemia).  Gestational diabetes (between 24 and 28 weeks).  Rh antibodies.  Urine tests to check for infections, diabetes, or protein in the urine.  An ultrasound to confirm the proper growth and development of the baby.  An amniocentesis to check for possible genetic problems.  Fetal screens for spina bifida and Down syndrome. HOME CARE INSTRUCTIONS   Avoid all smoking, herbs, alcohol, and unprescribed   drugs. These chemicals affect the formation and growth of the baby.  Follow your health care provider's instructions regarding medicine use. There are medicines that are either safe or unsafe to take during pregnancy.  Exercise only as directed by your health care provider. Experiencing uterine cramps is a good sign to stop exercising.  Continue to eat regular, healthy meals.  Wear a good support bra for breast tenderness.  Do not use hot tubs, steam rooms, or saunas.  Wear your  seat belt at all times when driving.  Avoid raw meat, uncooked cheese, cat litter boxes, and soil used by cats. These carry germs that can cause birth defects in the baby.  Take your prenatal vitamins.  Try taking a stool softener (if your health care provider approves) if you develop constipation. Eat more high-fiber foods, such as fresh vegetables or fruit and whole grains. Drink plenty of fluids to keep your urine clear or pale yellow.  Take warm sitz baths to soothe any pain or discomfort caused by hemorrhoids. Use hemorrhoid cream if your health care provider approves.  If you develop varicose veins, wear support hose. Elevate your feet for 15 minutes, 3-4 times a day. Limit salt in your diet.  Avoid heavy lifting, wear low heel shoes, and practice good posture.  Rest with your legs elevated if you have leg cramps or low back pain.  Visit your dentist if you have not gone yet during your pregnancy. Use a soft toothbrush to brush your teeth and be gentle when you floss.  A sexual relationship may be continued unless your health care provider directs you otherwise.  Continue to go to all your prenatal visits as directed by your health care provider. SEEK MEDICAL CARE IF:   You have dizziness.  You have mild pelvic cramps, pelvic pressure, or nagging pain in the abdominal area.  You have persistent nausea, vomiting, or diarrhea.  You have a bad smelling vaginal discharge.  You have pain with urination. SEEK IMMEDIATE MEDICAL CARE IF:   You have a fever.  You are leaking fluid from your vagina.  You have spotting or bleeding from your vagina.  You have severe abdominal cramping or pain.  You have rapid weight gain or loss.  You have shortness of breath with chest pain.  You notice sudden or extreme swelling of your face, hands, ankles, feet, or legs.  You have not felt your baby move in over an hour.  You have severe headaches that do not go away with  medicine.  You have vision changes. Document Released: 08/14/2001 Document Revised: 08/25/2013 Document Reviewed: 10/21/2012 ExitCare Patient Information 2015 [employer]. This information is not intended to replace advice given to you by your health care provider. Make sure you discuss any questions you have with your health care provider.  

## 2015-05-20 NOTE — Progress Notes (Signed)
NOB PE: NEW OB HISTORY AND PHYSICAL  SUBJECTIVE:       Kari Tanner is a 29 y.o. [phone] female, Patient's last menstrual period was 02/26/2015., Estimated Date of Delivery: 12/03/15, [redacted]w[redacted]d, presents today for establishment of Prenatal Care. She has no unusual complaints and complains of occasional spotting-some today      Gynecologic History Patient's last menstrual period was 02/26/2015. Normal Contraception: none Last Pap: unsure. Results were: normal  Obstetric History OB History  Gravida Para Term Preterm AB SAB TAB Ectopic Multiple Living  6    2 2    3     # Outcome Date GA Lbr Len/2nd Weight Sex Delivery Anes PTL Lv  6 Current           5 SAB 2012          4 SAB 2012          3 Gravida 03/17/08    F CS-Unspec   Y  2 Gravida 08/04/06    M CS-Unspec   Y  1 Gravida 07/28/00    F Vag-Spont   Y      Past Medical History  Diagnosis Date  . History of miscarriage, currently pregnant   . Ovarian cyst     Past Surgical History  Procedure Laterality Date  . C sections      Current Outpatient Prescriptions on File Prior to Visit  Medication Sig Dispense Refill  . ondansetron (ZOFRAN ODT) 4 MG disintegrating tablet Take 1 tablet (4 mg total) by mouth every 8 (eight) hours as needed for nausea or vomiting. 20 tablet 3  . Prenatal Vit-Fe Fumarate-FA (PRENATAL MULTIVITAMIN) TABS tablet Take 1 tablet by mouth at bedtime.      No current facility-administered medications on file prior to visit.    No Known Allergies  Social History   Social History  . Marital Status: Married    Spouse Name: N/A  . Number of Children: N/A  . Years of Education: N/A   Occupational History  . Not on file.   Social History Main Topics  . Smoking status: Never Smoker   . Smokeless tobacco: Never Used  . Alcohol Use: No  . Drug Use: No  . Sexual Activity: Yes    Birth Control/ Protection: None   Other Topics Concern  . Not on file   Social History Narrative    Family  History  Problem Relation Age of Onset  . Diabetes Mother   . Hypertension Mother   . Hypertension Paternal Grandmother     The following portions of the patient's history were reviewed and updated as appropriate: allergies, current medications, past OB history, past medical history, past surgical history, past family history, past social history, and problem list.    OBJECTIVE: Initial Physical Exam (New OB)  GENERAL APPEARANCE: alert, well appearing, in no apparent distress, oriented to person, place and time HEAD: normocephalic, atraumatic MOUTH: mucous membranes moist, pharynx normal without lesions THYROID: no thyromegaly or masses present BREASTS: no masses noted, no significant tenderness, no palpable axillary nodes, no skin changes LUNGS: clear to auscultation, no wheezes, rales or rhonchi, symmetric air entry HEART: regular rate and rhythm, no murmurs ABDOMEN: soft, nontender, nondistended, no abnormal masses, no epigastric pain, obese, fundus soft, nontender 13 weeks size and FHT present EXTREMITIES: no redness or tenderness in the calves or thighs SKIN: normal coloration and turgor, no rashes LYMPH NODES: no adenopathy palpable NEUROLOGIC: alert, oriented, normal speech, no focal findings or movement disorder noted  PELVIC EXAM EXTERNAL GENITALIA: normal appearing vulva with no masses, tenderness or lesions VAGINA: discharge brown and blood scant CERVIX: no lesions or cervical motion tenderness UTERUS: gravid and consistent with 13 weeks ADNEXA: no masses palpable and nontender  ASSESSMENT: Obesity with elevated HgA1C Second pregnancy complicated by GDM with insulin needed, third pregnancy normal- 2 previous c/s deliveries after SVD at term  PLAN: Prenatal care- will send to Dr Valentino Saxon as she is high risk RTC 4 weeks for early glucola See orders Indications:First trimester screening. Findings:  Singleton intrauterine pregnancy is visualized with a CRL consistent  with 12 2/[redacted] weeks gestation, giving an (U/S) EDD of 11-30-2015 The (U/S) EDD is consistent with the clinically established (LMP) EDD of 12-03-2015.  FHR: 162 BPM CRL measurement: 56.4  mm Yolk sac and and early anatomy is normal. NT measures 1.8 mm  Left Ovary measures 2.5 x 3.5 x 3.7 cm. It is normal in appearance. Right Ovary measures 3.6 x 2.9 x 2.4  cm. It is normal appearance.  Survey of the adnexa demonstrates no adnexal masses. There is no free peritoneal fluid in the cul de sac.  Impression: 1. 12 2/7 week Viable Singleton Intrauterine pregnancy by U/S. 2. (U/S) EDD is consistent with Clinically established (LMP) EDD of 12-03-2015.

## 2015-05-20 NOTE — Progress Notes (Signed)
ROB-pt is still having some brownish d/c

## 2015-05-26 LAB — FIRST TRIMESTER SCREEN W/NT
CRL: 56.4 mm
DIA MOM: 1.52
DIA VALUE: 383.6 pg/mL
Gest Age-Collect: 12.1 weeks
HCG MOM: 0.73
MATERNAL AGE AT EDD: 31.1 a
NUMBER OF FETUSES: 1
Nuchal Translucency MoM: 1.23
Nuchal Translucency: 1.8 mm
PAPP-A MoM: 0.6
PAPP-A Value: 507.9 ng/mL
TEST RESULTS: NEGATIVE
hCG Value: 72.7 IU/mL

## 2015-05-27 ENCOUNTER — Telehealth: Payer: Self-pay | Admitting: Obstetrics and Gynecology

## 2015-05-27 NOTE — Telephone Encounter (Signed)
Stomach bug?  Diarrhea nausea.. Advice on what to do?

## 2015-05-30 NOTE — Telephone Encounter (Signed)
Pt states she has only nausea and diarrhea. Since pregnancy she has been nauseous. Has rx for diclegis and zofran but declines meds. Low grade fever 100.6 on  Thursday only. Last diarrhea was on Friday. Pt is staying hydrated and able to eat a normal diet. Advised eat small frequent meals. Stay hydrated. Preggie pops or ginger ale. B6 and unisom. Pt inquired about nt results- neg.

## 2015-06-15 LAB — SPECIMEN STATUS REPORT

## 2015-06-16 ENCOUNTER — Ambulatory Visit (INDEPENDENT_AMBULATORY_CARE_PROVIDER_SITE_OTHER): Payer: BLUE CROSS/BLUE SHIELD | Admitting: Obstetrics and Gynecology

## 2015-06-16 ENCOUNTER — Encounter: Payer: Self-pay | Admitting: Obstetrics and Gynecology

## 2015-06-16 ENCOUNTER — Other Ambulatory Visit: Payer: BLUE CROSS/BLUE SHIELD

## 2015-06-16 VITALS — BP 127/79 | HR 108 | Wt 243.4 lb

## 2015-06-16 DIAGNOSIS — O09292 Supervision of pregnancy with other poor reproductive or obstetric history, second trimester: Secondary | ICD-10-CM

## 2015-06-16 DIAGNOSIS — Z3492 Encounter for supervision of normal pregnancy, unspecified, second trimester: Secondary | ICD-10-CM

## 2015-06-16 DIAGNOSIS — Z8632 Personal history of gestational diabetes: Secondary | ICD-10-CM

## 2015-06-16 DIAGNOSIS — R81 Glycosuria: Secondary | ICD-10-CM

## 2015-06-16 LAB — POCT URINALYSIS DIP (MANUAL ENTRY)
BILIRUBIN UA: NEGATIVE
Leukocytes, UA: NEGATIVE
NITRITE UA: NEGATIVE
Protein Ur, POC: NEGATIVE
RBC UA: NEGATIVE
Spec Grav, UA: 1.025
UROBILINOGEN UA: 0.2
pH, UA: 6

## 2015-06-16 NOTE — Progress Notes (Signed)
Patient is doing well today, she has no complaints. She had a FLU shot through work on September 30

## 2015-06-16 NOTE — Progress Notes (Signed)
ROB: Patient referred from [facility] due to high risk pregnancy.  Patient with h/o 2 prior C-sections after succesful vaginal delivery, h/o GDM in 2nd pregnancy requiring inuslin, currently with elevated HgbA1c (6.2%) and glucose (219), and preterm delivery x 2.  Discussion had with patient regarding h/o 2 prior C-sections, desires to VBAC if possible.  DIscused risks/benefits of VBAC.  Also discussed that based on results of early glucola (being performed today) and glucosuria, patient is likely diabetic in current pregnancy, and will require medications, and IOL at 30 weeks.  Discussed that TOLAC usually does not allow for IOL with Cytotec, however if faborable cervix at 39 weeks, can consider IOL by other means (Foley bulb/pitocin).  Patient with h/o preterm delivery x 2, recommend 17-OHP during this pregnancy.  Also recommend aspirin 81 mg daily for likely DM and obesity. Will likely need referral to [facility] for DM. RTC weekly for 17-OHP injections, in 4 weeks for OB visit, for anatomy scan at next visit.

## 2015-06-17 ENCOUNTER — Telehealth: Payer: Self-pay | Admitting: Obstetrics and Gynecology

## 2015-06-17 DIAGNOSIS — O24911 Unspecified diabetes mellitus in pregnancy, first trimester: Secondary | ICD-10-CM

## 2015-06-17 LAB — GLUCOSE, 1 HOUR GESTATIONAL: GESTATIONAL DIABETES SCREEN: 374 mg/dL — AB (ref 65–139)

## 2015-06-17 NOTE — Telephone Encounter (Signed)
Pt called and she said she received a phone call from you on Friday, possibly about labs results, told pt you would call her back on Monday.

## 2015-06-20 MED ORDER — ACCU-CHEK FASTCLIX LANCETS MISC: 4.0000 | Freq: Four times a day (QID) | Status: DC

## 2015-06-20 MED ORDER — ACCU-CHEK AVIVA DEVI: Status: DC

## 2015-06-20 MED ORDER — GLUCOSE BLOOD VI STRP: ORAL_STRIP | Status: DC

## 2015-06-20 NOTE — Telephone Encounter (Signed)
Pt notified of 1 hr. Glucose elevated at 374 and needs to check blood sugars 4xday (meter, strips, lancets ordered); referral for [facility]. Follow up for next Monday since pt could not be reached on Friday.  PT ALSO STATES SHE DOES NOT WANT THE "Sex" of the baby revealed to her. Her friend at work [employer](Maggie [phone]) is to be told only. She is having a gender reveal party.  Pt also wants to know about her 17p (progesterone). I told her it is usually started about 16 wks. I have not received a request to order this yet.

## 2015-06-20 NOTE — Telephone Encounter (Signed)
Ok.  Will keep gender information concealed.  I completed her form for 17-P on Thursday evening, I do [facility], perhaps Teressa LowerCarrie Garrison may have it.

## 2015-06-23 NOTE — Telephone Encounter (Signed)
Pt notified that hydroxyprogesterone caproate injection ordered. We are trying to order this through her BC[employer] first then medicaid if they do not cover medication.

## 2015-06-27 ENCOUNTER — Encounter: Payer: Self-pay | Admitting: Obstetrics and Gynecology

## 2015-06-27 ENCOUNTER — Ambulatory Visit (INDEPENDENT_AMBULATORY_CARE_PROVIDER_SITE_OTHER): Payer: BLUE CROSS/BLUE SHIELD | Admitting: Obstetrics and Gynecology

## 2015-06-27 ENCOUNTER — Telehealth: Payer: Self-pay

## 2015-06-27 VITALS — BP 108/72 | HR 96 | Wt 246.8 lb

## 2015-06-27 DIAGNOSIS — Z3492 Encounter for supervision of normal pregnancy, unspecified, second trimester: Secondary | ICD-10-CM | POA: Diagnosis not present

## 2015-06-27 DIAGNOSIS — O09292 Supervision of pregnancy with other poor reproductive or obstetric history, second trimester: Secondary | ICD-10-CM

## 2015-06-27 DIAGNOSIS — R81 Glycosuria: Secondary | ICD-10-CM

## 2015-06-27 DIAGNOSIS — Z8632 Personal history of gestational diabetes: Principal | ICD-10-CM

## 2015-06-27 LAB — POCT URINALYSIS DIPSTICK
Bilirubin, UA: NEGATIVE
Ketones, UA: NEGATIVE
Leukocytes, UA: NEGATIVE
Nitrite, UA: NEGATIVE
PROTEIN UA: NEGATIVE
RBC UA: NEGATIVE
SPEC GRAV UA: 1.025
UROBILINOGEN UA: 0.2
pH, UA: 6

## 2015-06-27 MED ORDER — INSULIN NPH (HUMAN) (ISOPHANE) 100 UNIT/ML ~~LOC~~ SUSP: 40.0000 [IU] | Freq: Every day | SUBCUTANEOUS | Status: DC

## 2015-06-27 MED ORDER — INSULIN REGULAR HUMAN 100 UNIT/ML IJ SOLN: 20.0000 [IU] | Freq: Every day | INTRAMUSCULAR | Status: DC

## 2015-06-27 NOTE — Progress Notes (Signed)
Pt presents for review of bs log. See  log-Pt only checked bs on 2 separate days. Bs are very elevated. She has an appt with [facility] on 10/28. [facility]20 they have not contacted pt yet. Aware panorama is normal and DR to contact Maggie with gender results. [facility] will initiate insulin. Dr to order insulin and contact pt.

## 2015-06-27 NOTE — Telephone Encounter (Signed)
Pt notified of insulins of Regular and NPH dosage and frequency. rx sent to pharmacy.

## 2015-06-28 ENCOUNTER — Other Ambulatory Visit: Payer: Self-pay | Admitting: Obstetrics and Gynecology

## 2015-06-29 ENCOUNTER — Telehealth: Payer: Self-pay | Admitting: Obstetrics and Gynecology

## 2015-06-29 DIAGNOSIS — O24912 Unspecified diabetes mellitus in pregnancy, second trimester: Secondary | ICD-10-CM

## 2015-06-29 MED ORDER — INSULIN SYRINGES (DISPOSABLE) U-100 1 ML MISC: 2.0000 | Freq: Two times a day (BID) | Status: DC

## 2015-06-29 NOTE — Telephone Encounter (Signed)
Pt notified of syringe order sent in and her friend "Seward GraterMaggie" knows sex of baby and told pt last night.

## 2015-06-29 NOTE — Progress Notes (Signed)
I have reviewed the information and agree with documentation as noted.  Patient to begin insulin regimen, and will manage [facility].

## 2015-06-29 NOTE — Telephone Encounter (Signed)
Patient stating her syringes were not called in yesterday and she needs them asap. Thanks

## 2015-06-30 ENCOUNTER — Encounter: Payer: Self-pay | Admitting: *Deleted

## 2015-06-30 ENCOUNTER — Encounter: Payer: BLUE CROSS/BLUE SHIELD | Attending: Obstetrics and Gynecology | Admitting: *Deleted

## 2015-06-30 VITALS — BP 120/64 | Ht 63.0 in | Wt 248.8 lb

## 2015-06-30 DIAGNOSIS — O24419 Gestational diabetes mellitus in pregnancy, unspecified control: Secondary | ICD-10-CM | POA: Diagnosis not present

## 2015-06-30 DIAGNOSIS — O24414 Gestational diabetes mellitus in pregnancy, insulin controlled: Secondary | ICD-10-CM

## 2015-06-30 NOTE — Progress Notes (Signed)
Diabetes Self-Management Education  Visit Type: First/Initial  Appt. Start Time: 1510 Appt. End Time: 1640  07/01/2015  Kari Tanner, identified by name and date of birth, is a 29 y.o. female with a diagnosis of Diabetes: Gestational Diabetes.   ASSESSMENT  Blood pressure 120/64, height '5\' 3"'  (1.6 m), weight 248 lb 12.8 oz (112.855 kg), last menstrual period 02/26/2015. Body mass index is 44.08 kg/(m^2).      Diabetes Self-Management Education - 06/30/15 1814    Visit Information   Visit Type First/Initial   Initial Visit   Diabetes Type Gestational Diabetes   Are you currently following a meal plan? No   Are you taking your medications as prescribed? Yes  just started insulin last night   Date Diagnosed 2 weeks ago   Health Coping   How would you rate your overall health? Good   Psychosocial Assessment   Patient Belief/Attitude about Diabetes Afraid  "worried"   Self-care barriers None   Self-management support Doctor's office;Family   Patient Concerns Nutrition/Meal planning;Healthy Lifestyle;Glycemic Control;Medication   Special Needs None   Preferred Learning Style Auditory;Visual;Hands on   Learning Readiness Ready   How often do you need to have someone help you when you read instructions, pamphlets, or other written materials from your doctor or pharmacy? 1 - Never   What is the last grade level you completed in school? some college   Complications   How often do you check your blood sugar? 3-4 times/day   Fasting Blood glucose range (mg/dL) 180-200;>200  FBG's 173-213 mg/dL   Postprandial Blood glucose range (mg/dL) 180-200;>200  pp's 180-279 mg/dL   Have you had a dilated eye exam in the past 12 months? No   Have you had a dental exam in the past 12 months? No   Are you checking your feet? No   Dietary Intake   Breakfast fast foods - biscuit and hashbrown   Lunch grilled chicken wrap, shrimp, rice salad   Dinner same as lunch   Beverage(s)  water, unsweetened beverages, regular soda 2 x week   Exercise   Exercise Type ADL's   Patient Education   Previous Diabetes Education Yes (please comment)  gestational diabetes education in past   Disease state  Definition of diabetes, type 1 and 2, and the diagnosis of diabetes   Nutrition management  Role of diet in the treatment of diabetes and the relationship between the three main macronutrients and blood glucose level   Physical activity and exercise  Role of exercise on diabetes management, blood pressure control and cardiac health.   Medications Taught/reviewed insulin injection, site rotation, insulin storage and needle disposal.;Reviewed patients medication for diabetes, action, purpose, timing of dose and side effects.   Monitoring Purpose and frequency of SMBG.;Identified appropriate SMBG and/or A1C goals.   Acute complications Taught treatment of hypoglycemia - the 15 rule.   Chronic complications Relationship between chronic complications and blood glucose control   Psychosocial adjustment Identified and addressed patients feelings and concerns about diabetes   Preconception care Pregnancy and GDM  Role of pre-pregnancy blood glucose control on the development of the fetus;Reviewed with patient blood glucose goals with pregnancy   Individualized Goals (developed by patient)   Health Coping Lose weight - after baby is born Lead a healthier lifestyle Become more fit   Outcomes   Expected Outcomes Demonstrated interest in learning. Expect positive outcomes      Individualized Plan for Diabetes Self-Management Training:   Learning Objective:  Patient will have a greater understanding of diabetes self-management. Patient education plan is to attend individual and/or group sessions per assessed needs and concerns.   Plan:   Patient Instructions  Read booklet on Gestational Diabetes Follow Gestational Meal Planning Guidelines Complete a 3 Day Food Record and bring to next  appointment Check blood sugars 4 x day - before breakfast and 2 hrs after every meal and record  Bring blood sugar log to next appointment Purchase urine ketone strips Check urine ketones every am:  If + increase bedtime snack to 1 protein and 2 carbohydrate servings Walk 20-30 minutes at least 5 x week if permitted by MD Avoid sugar sweetened beverages unless treating a low blood sugar Carry fast acting glucose and a snack Limit foods high in fat   Expected Outcomes:  Demonstrated interest in learning. Expect positive outcomes  Education material provided:  Gestational Booklet Gestational Meal Planning Guidelines Insulin start kit (BD) Hypoglycemia (Novo) 3 Day Food Record Goals for a Healthy Pregnancy  If problems or questions, patient to contact team via:  Johny Drilling, [place], [place], CDE [phone]  Future DSME appointment:  Wednesday November 9 at 3:00 pm with dietitan

## 2015-07-01 NOTE — Patient Instructions (Signed)
Read booklet on Gestational Diabetes Follow Gestational Meal Planning Guidelines Complete a 3 Day Food Record and bring to next appointment Check blood sugars 4 x day - before breakfast and 2 hrs after every meal and record  Bring blood sugar log to next appointment Purchase urine ketone strips Check urine ketones every am:  If + increase bedtime snack to 1 protein and 2 carbohydrate servings Walk 20-30 minutes at least 5 x week if permitted by MD Avoid sugar sweetened beverages unless treating a low blood sugar Carry fast acting glucose and a snack Limit foods high in fat

## 2015-07-04 ENCOUNTER — Telehealth: Payer: Self-pay

## 2015-07-04 ENCOUNTER — Ambulatory Visit
Admission: RE | Admit: 2015-07-04 | Discharge: 2015-07-04 | Disposition: A | Discharge status: Home / Self Care | Payer: BLUE CROSS/BLUE SHIELD | Source: Ambulatory Visit | Attending: Obstetrics and Gynecology | Admitting: Obstetrics and Gynecology

## 2015-07-04 VITALS — BP 115/69 | HR 109 | Temp 98.0°F | Wt 247.0 lb

## 2015-07-04 DIAGNOSIS — O24912 Unspecified diabetes mellitus in pregnancy, second trimester: Secondary | ICD-10-CM | POA: Diagnosis not present

## 2015-07-04 DIAGNOSIS — O09213 Supervision of pregnancy with history of pre-term labor, third trimester: Secondary | ICD-10-CM

## 2015-07-04 DIAGNOSIS — O99212 Obesity complicating pregnancy, second trimester: Secondary | ICD-10-CM

## 2015-07-04 DIAGNOSIS — O09211 Supervision of pregnancy with history of pre-term labor, first trimester: Secondary | ICD-10-CM

## 2015-07-04 DIAGNOSIS — O99210 Obesity complicating pregnancy, unspecified trimester: Secondary | ICD-10-CM | POA: Insufficient documentation

## 2015-07-04 DIAGNOSIS — E669 Obesity, unspecified: Secondary | ICD-10-CM

## 2015-07-04 DIAGNOSIS — O09891 Supervision of other high risk pregnancies, first trimester: Secondary | ICD-10-CM

## 2015-07-04 DIAGNOSIS — O24919 Unspecified diabetes mellitus in pregnancy, unspecified trimester: Secondary | ICD-10-CM | POA: Insufficient documentation

## 2015-07-04 DIAGNOSIS — O09893 Supervision of other high risk pregnancies, third trimester: Secondary | ICD-10-CM | POA: Insufficient documentation

## 2015-07-04 HISTORY — DX: Type 2 diabetes mellitus without complications: E11.9

## 2015-07-04 HISTORY — DX: Obesity complicating pregnancy, unspecified trimester: O99.210

## 2015-07-04 MED ORDER — INSULIN LISPRO 100 UNIT/ML ~~LOC~~ SOLN: 10.0000 [IU] | Freq: Three times a day (TID) | SUBCUTANEOUS | Status: DC

## 2015-07-04 MED ORDER — INSULIN GLARGINE 100 UNIT/ML ~~LOC~~ SOLN: 40.0000 [IU] | Freq: Every day | SUBCUTANEOUS | Status: DC

## 2015-07-04 NOTE — Telephone Encounter (Signed)
Prescription for Lantus and humalog called [address] [facility] on [address] [place]

## 2015-07-04 NOTE — Progress Notes (Signed)
[facility] Consultation   Chief Complaint: Advice regarding diabetes in pregnancy  HPI: [name] is a 29 y.o. [id number] at 5958w2d by LMP = 6247w1d US who presents in consultation from [facility] for recommendations regarding diabetes in pregnancy.  She reports having had GDM requiring insulin during her pregnancy in 2007 but not in 2009.  She was noted to have an elevated A1C and an early glucola of 374! this pregnancy.  She also has had 2 spontaneous preterm deliveries and has an elevated BMI (morbid obesity range).  Past Medical History: Patient  has a past medical history of History of miscarriage, currently pregnant; Ovarian cyst (dermoid on the right); Diabetes mellitus without complication (HCC); and Obesity affecting pregnancy (2016).  Past Surgical History: She  has past surgical history that includes c sections; Ovarian cyst surgery (Right, 2007); and Cesarean section.  Obstetric History:  OB History    Gravida Para Term Preterm AB TAB SAB Ectopic Multiple Living   6 3 1 2 2  2   3     2001 NVD at 8039 weeks, female, 7#2oz, no complications 2007 c/s due to fetal HR decelerations, 34 weeks, female, 6#, GDM 2009 repeat c/s at 35 weeks (spontaneous PTL), female, 7#4oz 2012 10-11 week miscarriage 2012 5-6 week miscarriage (D&C required) 2016 current  Gynecologic History:  Patient's last menstrual period was 02/26/2015.  Hx of abnormal pap smears: no Last pap smear was normal   Medications: 81mg  ASA, PNVs, NPH 40/5 and Regular 20/15 Allergies: Patient has No Known Allergies.  Social History: Patient  reports that she has quit smoking. She has never used smokeless tobacco. She reports that she does not drink alcohol or use illicit drugs.  Family History: family history includes Diabetes in her maternal aunt, maternal grandfather, and mother; Hypertension in her mother and paternal grandmother.  Review of Systems A full 12 point review of systems was negative or as  noted in the History of Present Illness.  Physical Exam: BP 115/69 mmHg  Pulse 109  Temp(Src) 98 F (36.7 C)  Wt 247 lb (112.038 kg)  LMP 02/26/2015   Asessement: 1. Diabetes mellitus during pregnancy, antepartum, second trimester   2. Maternal obesity, antepartum, second trimester   3. History of preterm delivery, currently pregnant in first trimester     Plan: 1.  Diabetes -  We spent 30 minutes with Kari Tanner of which more than 50% was counseling and coordinating care. We discussed the effects of her diabetes on the pregnancy and the fetus as well as the pregnancy effects on her diabetes. We reviewed the increased risk of congenital anomalies, fetal growth disturbances, fetal death, and neonatal metabolic complications and the importance of good glucose control to minimize these risks. We discussed increasing insulin requirements with advancing pregnancy and the need for frequent adjustment of insulin dosages to meet these needs. We cautioned her about over control and the need to avoid hypoglycemia. She was given a 2100-2200 calorie meal plan by [employer] and information about carbohydrate and protein requirements in pregnancy.   The goals of her insulin regimen is to have: 1. Fasting a capillary blood glucose between 60 and 95 mg/dl; 2. Two hour postprandial capillary blood glucoses less than 120 mg/dl.  In addition, we have the following recommendations:  1. Baseline EKG; 2. Twenty-four hour urine for total protein and creatinine clearance (or a p/c ratio) 3. Ophthalmological examination; 4. Fetal anatomic survey between 16-[redacted] weeks gestation (Please let us know if you would  like [place] to schedule this.); 5. Fetal cardiac evaluation between 22-[redacted] weeks gestation (scheduled);  6. Ultrasound for fetal growth every 4 weeks beginning at [redacted] weeks gestation;  7. Twice weekly nonstress tests beginning at [redacted] weeks gestation  8. Delivery planning will be determined once the  patient is closer to her estimated due date. This will be determined by:    A. Fetal size;    B. Amniotic fluid volume;    C. Level of glucose control;    D. Other medical or obstetrical complications.  To improve her diabetic control and ease changes in her insulin regimen, her insulin was changed to 40U Lantus qhs and 10U of Humalog before meals.   2.  Obesity - continue  ASA, add  folic acid.  Limit weight gain to <15#.  Recommend baseline CMP in addition to the p/c ratio above.  Fetal surveillance is the same as above.  3.  History of preterm delivery - agree with plan for weekly 17P injections until 36 weeks.  Would also recommend baseline cervical length (by TVUS) at 18 weeks and if >3cm, repeat at 20-22 weeks.  Pending results, this may need to be repeated or we would be more than happy to perform and provide recommendations for cerclage if needed.  Will schedule follow up MD appointment in 1 week to review her BS log.  Ms. Korman is aware that she will likely need frequent insulin changes until she is better controlled and we find the most appropriate dose without dropping her BS too low.  Total time spent with the patient was 30 minutes with greater than 50% spent in counseling and coordination of care. We appreciate this interesting consult and will be happy to be involved in the ongoing care of Kari Tanner in anyway her obstetricians desire.  Kirby Funk, MD Maternal-Fetal Medicine [facility]

## 2015-07-05 ENCOUNTER — Ambulatory Visit (INDEPENDENT_AMBULATORY_CARE_PROVIDER_SITE_OTHER): Payer: BLUE CROSS/BLUE SHIELD | Admitting: Obstetrics and Gynecology

## 2015-07-05 VITALS — BP 106/74 | HR 98 | Wt 249.7 lb

## 2015-07-05 DIAGNOSIS — O24112 Pre-existing diabetes mellitus, type 2, in pregnancy, second trimester: Secondary | ICD-10-CM | POA: Diagnosis not present

## 2015-07-05 DIAGNOSIS — E669 Obesity, unspecified: Secondary | ICD-10-CM | POA: Diagnosis not present

## 2015-07-05 DIAGNOSIS — O99212 Obesity complicating pregnancy, second trimester: Secondary | ICD-10-CM | POA: Diagnosis not present

## 2015-07-05 DIAGNOSIS — O09211 Supervision of pregnancy with history of pre-term labor, first trimester: Secondary | ICD-10-CM | POA: Diagnosis not present

## 2015-07-05 DIAGNOSIS — O24912 Unspecified diabetes mellitus in pregnancy, second trimester: Secondary | ICD-10-CM | POA: Diagnosis not present

## 2015-07-05 DIAGNOSIS — O09891 Supervision of other high risk pregnancies, first trimester: Secondary | ICD-10-CM

## 2015-07-05 DIAGNOSIS — O09212 Supervision of pregnancy with history of pre-term labor, second trimester: Secondary | ICD-10-CM | POA: Diagnosis not present

## 2015-07-05 DIAGNOSIS — O09892 Supervision of other high risk pregnancies, second trimester: Secondary | ICD-10-CM

## 2015-07-05 MED ORDER — FOLIC ACID 1 MG PO TABS: 1.0000 mg | ORAL_TABLET | Freq: Every day | ORAL | Status: DC

## 2015-07-05 MED ORDER — HYDROXYPROGESTERONE CAPROATE 250 MG/ML IM OIL
250.0000 mg | TOPICAL_OIL | Freq: Once | INTRAMUSCULAR | Status: AC
  Administered 2015-07-05: 250 mg via INTRAMUSCULAR

## 2015-07-05 NOTE — Patient Instructions (Signed)
You have been referred to Opthalmology for an eye exam.  Please begin new insulin regimen as recommended by [facility] once insulin received. You will be scheduled for an EKG.    RE: MyChart  Dear Kari Tanner  We are excited to introduce MyChart, a new best-in-class service that provides you online access to important information in your electronic medical record. We want to make it easier for you to view your health information - all in one secure location - when and where you need it. We expect MyChart will enhance the quality of care and service we provide. Use the activation code below to enroll in MyChart online at [email or web address]  When you register for MyChart, you can:  Marland Kitchen. View your test results. . Communicate securely with your physician's office.  . View your medical history, allergies, medications, and immunizations. . Conveniently print information such as your medication lists.  If you are age 3018 or older and want a member of your family to have access to your record, you must provide written consent by completing a proxy form available at our facility. Please speak to our clinical staff about guidelines regarding accounts for patients younger than age 30.  As you activate your MyChart account and need any technical assistance, please call the MyChart technical support line at [phone]) or email your question to [email or web address]. If you email your question(s), please include your name, a return phone number and the best time to reach you.  Thank you for using MyChart as your new health and wellness resource!  MyChart Activation Code:  [email or web address] Expires: 08/15/2015  9:45 AM

## 2015-07-06 LAB — COMPREHENSIVE METABOLIC PANEL
ALBUMIN: 3.6 g/dL (ref 3.5–5.5)
ALT: 35 IU/L — ABNORMAL HIGH (ref 0–32)
AST: 25 IU/L (ref 0–40)
Albumin/Globulin Ratio: 1.2 (ref 1.1–2.5)
Alkaline Phosphatase: 74 IU/L (ref 39–117)
BUN/Creatinine Ratio: 13 (ref 8–20)
BUN: 8 mg/dL (ref 6–20)
Bilirubin Total: <0.2 mg/dL (ref 0.0–1.2)
CALCIUM: 9.7 mg/dL (ref 8.7–10.2)
CO2: 19 mmol/L (ref 18–29)
CREATININE: 0.63 mg/dL (ref 0.57–1.00)
Chloride: 97 mmol/L (ref 97–106)
GFR calc Af Amer: 139 mL/min/{1.73_m2} (ref >59)
GFR, EST NON AFRICAN AMERICAN: 121 mL/min/{1.73_m2} (ref >59)
GLOBULIN, TOTAL: 3.1 g/dL (ref 1.5–4.5)
GLUCOSE: 218 mg/dL — AB (ref 65–99)
Potassium: 4.1 mmol/L (ref 3.5–5.2)
SODIUM: 134 mmol/L — AB (ref 136–144)
Total Protein: 6.7 g/dL (ref 6.0–8.5)

## 2015-07-06 LAB — PROTEIN / CREATININE RATIO, URINE
Creatinine, Urine: 158.3 mg/dL
PROTEIN UR: 11 mg/dL
Protein/Creat Ratio: 69 mg/g creat (ref 0–200)

## 2015-07-06 NOTE — Progress Notes (Signed)
ROB: Normal Panorama study. Patient reports seeing [facility] yesterday.  Insulin was changed to Lantus (40 units in evening) and Humalog (10 units 3 daily with meals). Patient notes that she received the Lantus prescription, Humalog was not available.  Has not initiated new meds.  Blood sugars on prior insulin regimen (NPH/Reg) have decreased from high 300s down to 150-250 in a.m. and 130s-200s postprandial.  Recommend beginning new regimen once both new insulin meds have been received. Has seen Nutritionist. Other recommendations from [facility] are as follows:  1) Agree with aspirin daily 2) Recommend anatomy scan with cervical length (TVUS) at 16-18 weeks. Will order. Also for fetal echo at 22-24 weeks.  3) Will need to increase folic acid to 1 mg daily.  4) Opthalmology consult 5) EKG, baseline CMP and P/C ratio.  6) Will need antenatal testing beginning at 32 weeks.  7) Weight gain of no more than 15 lbs for entire pregnancy.   Reiterated recommendations to patient who notes understanding.  Orders placed for all labs and consults. RTC in 2 weeks.   A total of 15 minutes were spent face-to-face with the patient during this encounter and over half of that time dealt with counseling and coordination of care.

## 2015-07-07 ENCOUNTER — Other Ambulatory Visit: Payer: Self-pay | Admitting: Obstetrics and Gynecology

## 2015-07-07 DIAGNOSIS — O24112 Pre-existing diabetes mellitus, type 2, in pregnancy, second trimester: Secondary | ICD-10-CM

## 2015-07-07 DIAGNOSIS — O09892 Supervision of other high risk pregnancies, second trimester: Secondary | ICD-10-CM

## 2015-07-07 DIAGNOSIS — O09212 Supervision of pregnancy with history of pre-term labor, second trimester: Secondary | ICD-10-CM

## 2015-07-11 ENCOUNTER — Ambulatory Visit
Admission: RE | Admit: 2015-07-11 | Discharge: 2015-07-11 | Disposition: A | Discharge status: Home / Self Care | Payer: BLUE CROSS/BLUE SHIELD | Source: Ambulatory Visit | Attending: Maternal & Fetal Medicine | Admitting: Maternal & Fetal Medicine

## 2015-07-11 ENCOUNTER — Ambulatory Visit: Payer: BLUE CROSS/BLUE SHIELD

## 2015-07-11 VITALS — BP 119/68 | HR 100 | Temp 97.9°F | Wt 249.0 lb

## 2015-07-11 DIAGNOSIS — Z3A19 19 weeks gestation of pregnancy: Secondary | ICD-10-CM

## 2015-07-11 DIAGNOSIS — O99212 Obesity complicating pregnancy, second trimester: Secondary | ICD-10-CM

## 2015-07-11 DIAGNOSIS — O24912 Unspecified diabetes mellitus in pregnancy, second trimester: Secondary | ICD-10-CM | POA: Diagnosis not present

## 2015-07-11 DIAGNOSIS — O09211 Supervision of pregnancy with history of pre-term labor, first trimester: Secondary | ICD-10-CM | POA: Diagnosis not present

## 2015-07-11 DIAGNOSIS — E669 Obesity, unspecified: Secondary | ICD-10-CM

## 2015-07-11 DIAGNOSIS — O09891 Supervision of other high risk pregnancies, first trimester: Secondary | ICD-10-CM

## 2015-07-11 NOTE — Progress Notes (Signed)
Ms. Kari AlbeeSharmanii Tanner is a 29 y.o. [id number] at 344w2d by LMP = 6428w1d US who returns today for BS review.  She was originally seen last week in consultation from [facility] for recommendations regarding diabetes in pregnancy.   Her history is significant for having had GDM requiring insulin during her pregnancy in 2007 but not in 2009. She was noted to have an elevated A1C and an early glucola of 374 this pregnancy.   She also has had 2 spontaneous preterm deliveries and has an elevated BMI (morbid obesity range).  [facility], she was prescribed 40U Lantus qhs and 10U of Humalog before meals.   Since her last visit, she has not been able to fill her prescription for Lantus and Humalog.  She has been continuing to take: AM 40N 20R HS 15N 5R  Blood sugar review: FBSs 11-142 with 6/6 > 95 2 hr PP breakfast 99-218 with 4/5 > 120 2 hr PP lunch 94-131 with 1/3 > 120 2 hr PP dinner 150-204 with 3/3 > 120  Impression:  29 yo [id number] at 904w2d with maternal obesity, history of preterm labor and poorly controlled blood sugars this pregnancy  Recommendations: 1. Please see Dr. Reuel DerbyEllestad's previous recommendations 2. Pre-authorization for her new regimen (Lantus 40 U q hs + Humalog 06/12/09) was confirmed today. 3. IF for some reason, she cannot fill the prescription she should adjust her current regimen to:     AM 40N 20R     PM 10 R (30 minutes before dinner)     HS 20 N 4. She was given an appointment in 1 week [facility].  I spent 15 minutes in consultation, more than half of which was spent counseling the patient and coordinating her care.  Argentina PonderAndra H. Rakeem Colley, MD [facility]

## 2015-07-12 ENCOUNTER — Encounter: Payer: BLUE CROSS/BLUE SHIELD | Admitting: Obstetrics and Gynecology

## 2015-07-12 ENCOUNTER — Ambulatory Visit: Payer: BLUE CROSS/BLUE SHIELD

## 2015-07-12 ENCOUNTER — Ambulatory Visit (INDEPENDENT_AMBULATORY_CARE_PROVIDER_SITE_OTHER): Payer: BLUE CROSS/BLUE SHIELD

## 2015-07-12 VITALS — BP 113/78 | HR 102 | Ht 63.0 in | Wt 251.6 lb

## 2015-07-12 DIAGNOSIS — O09212 Supervision of pregnancy with history of pre-term labor, second trimester: Secondary | ICD-10-CM | POA: Diagnosis not present

## 2015-07-12 LAB — POCT URINALYSIS DIPSTICK
BILIRUBIN UA: NEGATIVE
KETONES UA: NEGATIVE
LEUKOCYTES UA: NEGATIVE
Nitrite, UA: NEGATIVE
PH UA: 6
Protein, UA: NEGATIVE
RBC UA: NEGATIVE
SPEC GRAV UA: 1.01
Urobilinogen, UA: NEGATIVE

## 2015-07-12 MED ORDER — HYDROXYPROGESTERONE CAPROATE 250 MG/ML IM OIL
250.0000 mg | TOPICAL_OIL | Freq: Once | INTRAMUSCULAR | Status: AC
  Administered 2015-07-12: 250 mg via INTRAMUSCULAR

## 2015-07-12 NOTE — Progress Notes (Signed)
Patient ID: Doree AlbeeSharmanii Tanner, female   DOB: 04/19/1986, 29 y.o.   MRN: [id number] Pt here for hydroxyprogesterone (17p) injection for PTL. No c/o contractions. No vaginal bleeding.

## 2015-07-13 ENCOUNTER — Ambulatory Visit: Payer: BLUE CROSS/BLUE SHIELD | Admitting: Dietician

## 2015-07-14 ENCOUNTER — Encounter: Payer: BLUE CROSS/BLUE SHIELD | Admitting: Obstetrics and Gynecology

## 2015-07-14 ENCOUNTER — Other Ambulatory Visit: Payer: BLUE CROSS/BLUE SHIELD

## 2015-07-15 ENCOUNTER — Other Ambulatory Visit: Payer: BLUE CROSS/BLUE SHIELD

## 2015-07-18 ENCOUNTER — Ambulatory Visit: Payer: BLUE CROSS/BLUE SHIELD

## 2015-07-19 ENCOUNTER — Other Ambulatory Visit: Payer: BLUE CROSS/BLUE SHIELD

## 2015-07-19 ENCOUNTER — Ambulatory Visit (INDEPENDENT_AMBULATORY_CARE_PROVIDER_SITE_OTHER): Payer: BLUE CROSS/BLUE SHIELD | Admitting: Obstetrics and Gynecology

## 2015-07-19 VITALS — BP 100/67 | HR 108 | Wt 253.2 lb

## 2015-07-19 DIAGNOSIS — E669 Obesity, unspecified: Secondary | ICD-10-CM

## 2015-07-19 DIAGNOSIS — O09891 Supervision of other high risk pregnancies, first trimester: Secondary | ICD-10-CM

## 2015-07-19 DIAGNOSIS — O24912 Unspecified diabetes mellitus in pregnancy, second trimester: Secondary | ICD-10-CM

## 2015-07-19 DIAGNOSIS — O99212 Obesity complicating pregnancy, second trimester: Secondary | ICD-10-CM

## 2015-07-19 DIAGNOSIS — O09211 Supervision of pregnancy with history of pre-term labor, first trimester: Secondary | ICD-10-CM

## 2015-07-19 DIAGNOSIS — Z3492 Encounter for supervision of normal pregnancy, unspecified, second trimester: Secondary | ICD-10-CM

## 2015-07-19 LAB — POCT URINALYSIS DIPSTICK
BILIRUBIN UA: NEGATIVE
Blood, UA: NEGATIVE
GLUCOSE UA: 1000
KETONES UA: NEGATIVE
LEUKOCYTES UA: NEGATIVE
Nitrite, UA: NEGATIVE
PROTEIN UA: NEGATIVE
SPEC GRAV UA: 1.025
Urobilinogen, UA: NEGATIVE
pH, UA: 6

## 2015-07-19 NOTE — Progress Notes (Signed)
ROB: Patient doing well.  Has initiated new insulin regimen [facility].  Notes fastings are decreasing to below 200s, however postprandials (especially lunch and dinnner) still in mig 200s. Will defer to [facility] to manage. Scheduled for antomy scan and TVUS in 2 days. For fetal echo in 2 weeks.  Baseline CMP and P/C ratio normal, still pending EKG and Opthalmology consult. RTC in 4 weeks.

## 2015-07-20 MED ORDER — HYDROXYPROGESTERONE CAPROATE 250 MG/ML IM OIL
250.0000 mg | TOPICAL_OIL | Freq: Once | INTRAMUSCULAR | Status: AC
  Administered 2015-07-20: 250 mg via INTRAMUSCULAR

## 2015-07-20 NOTE — Addendum Note (Signed)
Addended by: Frankey ShownGLANTON, Hensley Aziz C on: 07/20/2015 11:24 AM   Modules accepted: Orders

## 2015-07-21 ENCOUNTER — Ambulatory Visit
Admission: RE | Admit: 2015-07-21 | Discharge: 2015-07-21 | Disposition: A | Discharge status: Home / Self Care | Payer: BLUE CROSS/BLUE SHIELD | Source: Ambulatory Visit | Attending: Obstetrics and Gynecology | Admitting: Obstetrics and Gynecology

## 2015-07-21 ENCOUNTER — Ambulatory Visit (HOSPITAL_BASED_OUTPATIENT_CLINIC_OR_DEPARTMENT_OTHER)
Admission: RE | Admit: 2015-07-21 | Discharge: 2015-07-21 | Disposition: A | Discharge status: Home / Self Care | Payer: BLUE CROSS/BLUE SHIELD | Source: Ambulatory Visit | Attending: Obstetrics and Gynecology | Admitting: Obstetrics and Gynecology

## 2015-07-21 VITALS — BP 123/71 | HR 106 | Temp 98.9°F | Resp 18 | Ht 63.6 in | Wt 251.8 lb

## 2015-07-21 DIAGNOSIS — O09211 Supervision of pregnancy with history of pre-term labor, first trimester: Secondary | ICD-10-CM

## 2015-07-21 DIAGNOSIS — Z3A20 20 weeks gestation of pregnancy: Secondary | ICD-10-CM | POA: Diagnosis not present

## 2015-07-21 DIAGNOSIS — O24912 Unspecified diabetes mellitus in pregnancy, second trimester: Secondary | ICD-10-CM

## 2015-07-21 DIAGNOSIS — O24112 Pre-existing diabetes mellitus, type 2, in pregnancy, second trimester: Secondary | ICD-10-CM

## 2015-07-21 DIAGNOSIS — O99212 Obesity complicating pregnancy, second trimester: Secondary | ICD-10-CM

## 2015-07-21 DIAGNOSIS — O09891 Supervision of other high risk pregnancies, first trimester: Secondary | ICD-10-CM

## 2015-07-21 DIAGNOSIS — O09212 Supervision of pregnancy with history of pre-term labor, second trimester: Secondary | ICD-10-CM | POA: Diagnosis present

## 2015-07-21 DIAGNOSIS — Z794 Long term (current) use of insulin: Secondary | ICD-10-CM | POA: Insufficient documentation

## 2015-07-21 DIAGNOSIS — E669 Obesity, unspecified: Secondary | ICD-10-CM | POA: Diagnosis not present

## 2015-07-21 DIAGNOSIS — O09892 Supervision of other high risk pregnancies, second trimester: Secondary | ICD-10-CM

## 2015-07-21 LAB — GLUCOSE, CAPILLARY: Glucose-Capillary: 183 mg/dL — ABNORMAL HIGH (ref 65–99)

## 2015-07-21 LAB — HEMOGLOBIN A1C: Hgb A1c MFr Bld: 7.1 % — ABNORMAL HIGH (ref 4.0–6.0)

## 2015-07-21 NOTE — Progress Notes (Signed)
[name] is a 29 y.o. Married AAF [id number] at 5047w5d by LMP = 362w1d US who returns today for BS review.  She is seen in  consultation from [facility] for ongoing  recommendations regarding diabetes in pregnancy.  Pt is native [place] speaker  here with her husband who is also fluent .  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  OB history  P1 29 yo no GDM term P2 GDM 35 weeks 6 lbs  P3 no GDM 36 w 7lbs    Her history is significant for having had GDM requiring insulin during her pregnancy in 2007 but not in 2009. She was noted to have an elevated A1C and an early glucola of 374 this pregnancy.  She also has had 2 spontaneous preterm deliveries she is on 2117 p this pregnancy  and has an elevated BMI (morbid obesity range).  S he has started  40U Lantus qhs and 10U of Humalog before meals.    I encouraged she put whole family on ADA diet   Blood sugar review:  FBSs 92-85 with 6/7> 95  2 hr PP breakfast 99-216 with 4/6 > 120  2 hr PP lunch 122-200 with 4/5 > 120  2 hr PP dinner 168-214 with 4/4 > 120   123/71 Weight 251  RBS 183 -pt eating cheezits on the way in to the office  Impression: 29 yo [id number] at 6647w5d with maternal obesity, history of preterm labor and poorly controlled blood sugars this pregnancy  Recommendations:  1 Increase insulin (Lantus 44 U q hs + Humalog 08/15/11) was confirmed today.  2 Use SSI additional 2 units humalog for every 50 above 150 - so if between 200-249 give 2 units and record , 250 -399  4units , above 300 give 6 units and call MFM on call  3. If total daily units approached 100 add metformin to regimen as insulin sensitizer 4. She was given an appointment in 2 weeks to [facility].  5. Check hgb a1c  Jimmey RalphLivingston, Kassondra Geil, MD

## 2015-07-22 ENCOUNTER — Ambulatory Visit
Admission: RE | Admit: 2015-07-22 | Discharge: 2015-07-22 | Disposition: A | Discharge status: Home / Self Care | Payer: BLUE CROSS/BLUE SHIELD | Source: Ambulatory Visit | Attending: Obstetrics and Gynecology | Admitting: Obstetrics and Gynecology

## 2015-07-22 DIAGNOSIS — O24112 Pre-existing diabetes mellitus, type 2, in pregnancy, second trimester: Secondary | ICD-10-CM | POA: Insufficient documentation

## 2015-07-26 ENCOUNTER — Ambulatory Visit (INDEPENDENT_AMBULATORY_CARE_PROVIDER_SITE_OTHER): Payer: BLUE CROSS/BLUE SHIELD | Admitting: Obstetrics and Gynecology

## 2015-07-26 VITALS — BP 106/69 | HR 66 | Wt 253.6 lb

## 2015-07-26 DIAGNOSIS — O09212 Supervision of pregnancy with history of pre-term labor, second trimester: Secondary | ICD-10-CM

## 2015-07-26 MED ORDER — HYDROXYPROGESTERONE CAPROATE 250 MG/ML IM OIL
250.0000 mg | TOPICAL_OIL | Freq: Once | INTRAMUSCULAR | Status: AC
  Administered 2015-07-26: 250 mg via INTRAMUSCULAR

## 2015-07-26 NOTE — Progress Notes (Signed)
Pt presents for 17p hyroxyprogesterone injection for Hx PTL. Pt denies any contractions or vaginal bleeding.

## 2015-08-02 ENCOUNTER — Ambulatory Visit (INDEPENDENT_AMBULATORY_CARE_PROVIDER_SITE_OTHER): Payer: BLUE CROSS/BLUE SHIELD | Admitting: Obstetrics and Gynecology

## 2015-08-02 ENCOUNTER — Ambulatory Visit: Payer: BLUE CROSS/BLUE SHIELD

## 2015-08-02 VITALS — BP 110/82 | HR 103 | Ht 63.6 in | Wt 253.1 lb

## 2015-08-02 DIAGNOSIS — O09212 Supervision of pregnancy with history of pre-term labor, second trimester: Secondary | ICD-10-CM | POA: Diagnosis not present

## 2015-08-02 MED ORDER — HYDROXYPROGESTERONE CAPROATE 250 MG/ML IM OIL
250.0000 mg | TOPICAL_OIL | Freq: Once | INTRAMUSCULAR | Status: AC
  Administered 2015-08-02: 250 mg via INTRAMUSCULAR

## 2015-08-02 NOTE — Progress Notes (Signed)
Patient ID: Kari AlbeeSharmanii Mclennan, female   DOB: 07/08/1986, 29 y.o.   MRN: [id number] Pt here for Hydroxyprogesterone 17p for hx of PTL. Pt denies any contractions or vaginal bleeding. Has had some braxton hicks contractions.

## 2015-08-04 ENCOUNTER — Ambulatory Visit: Payer: BLUE CROSS/BLUE SHIELD

## 2015-08-05 ENCOUNTER — Telehealth: Payer: Self-pay | Admitting: Dietician

## 2015-08-05 NOTE — Telephone Encounter (Signed)
Called patient to reschedule RD appointment which she cancelled on 07/13/15.  Left voicemail message requesting a call back to reschedule.

## 2015-08-09 ENCOUNTER — Ambulatory Visit (INDEPENDENT_AMBULATORY_CARE_PROVIDER_SITE_OTHER): Payer: BLUE CROSS/BLUE SHIELD | Admitting: Obstetrics and Gynecology

## 2015-08-09 ENCOUNTER — Ambulatory Visit: Payer: BLUE CROSS/BLUE SHIELD

## 2015-08-09 VITALS — BP 104/75 | HR 99 | Ht 63.6 in | Wt 255.2 lb

## 2015-08-09 DIAGNOSIS — O09212 Supervision of pregnancy with history of pre-term labor, second trimester: Secondary | ICD-10-CM

## 2015-08-09 MED ORDER — HYDROXYPROGESTERONE CAPROATE 250 MG/ML IM OIL
250.0000 mg | TOPICAL_OIL | Freq: Once | INTRAMUSCULAR | Status: AC
  Administered 2015-08-09: 250 mg via INTRAMUSCULAR

## 2015-08-09 NOTE — Progress Notes (Signed)
Patient ID: Kari AlbeeSharmanii Tanner, female   DOB: April 18, 1986, 29 y.o.   MRN: [id number] Pt presents for hydroxyprogesterone 17p injection for HX PTL. Pt denies any abdominal pain, contractions or vaginal bleeding.  Called in prescription refills on glucose strips (pharmacy states she has 11 refills on hand); and the insulin Pen Needles (mini pen), also refill x8824yr and also the Humulin N insuliln (pharmacist states she has 3 refills but was advised he could also refill for 6924yr.

## 2015-08-11 ENCOUNTER — Encounter: Payer: Self-pay | Admitting: Obstetrics and Gynecology

## 2015-08-11 ENCOUNTER — Ambulatory Visit: Payer: BLUE CROSS/BLUE SHIELD

## 2015-08-11 DIAGNOSIS — O24912 Unspecified diabetes mellitus in pregnancy, second trimester: Secondary | ICD-10-CM

## 2015-08-16 ENCOUNTER — Encounter: Payer: Self-pay | Admitting: Obstetrics and Gynecology

## 2015-08-16 ENCOUNTER — Encounter: Payer: BLUE CROSS/BLUE SHIELD | Admitting: Obstetrics and Gynecology

## 2015-08-16 ENCOUNTER — Ambulatory Visit (INDEPENDENT_AMBULATORY_CARE_PROVIDER_SITE_OTHER): Payer: BLUE CROSS/BLUE SHIELD | Admitting: Obstetrics and Gynecology

## 2015-08-16 VITALS — BP 107/70 | HR 99 | Wt 255.5 lb

## 2015-08-16 DIAGNOSIS — O99212 Obesity complicating pregnancy, second trimester: Secondary | ICD-10-CM

## 2015-08-16 DIAGNOSIS — E669 Obesity, unspecified: Secondary | ICD-10-CM

## 2015-08-16 DIAGNOSIS — O0992 Supervision of high risk pregnancy, unspecified, second trimester: Secondary | ICD-10-CM

## 2015-08-16 DIAGNOSIS — O09892 Supervision of other high risk pregnancies, second trimester: Secondary | ICD-10-CM

## 2015-08-16 DIAGNOSIS — O09212 Supervision of pregnancy with history of pre-term labor, second trimester: Secondary | ICD-10-CM

## 2015-08-16 DIAGNOSIS — O24912 Unspecified diabetes mellitus in pregnancy, second trimester: Secondary | ICD-10-CM

## 2015-08-16 DIAGNOSIS — O0993 Supervision of high risk pregnancy, unspecified, third trimester: Secondary | ICD-10-CM | POA: Insufficient documentation

## 2015-08-16 LAB — POCT URINALYSIS DIPSTICK
BILIRUBIN UA: NEGATIVE
Glucose, UA: 1000
Ketones, UA: NEGATIVE
LEUKOCYTES UA: NEGATIVE
NITRITE UA: NEGATIVE
PH UA: 6
PROTEIN UA: NEGATIVE
RBC UA: NEGATIVE
Spec Grav, UA: 1.025
UROBILINOGEN UA: NEGATIVE

## 2015-08-16 MED ORDER — TERCONAZOLE 0.4 % VA CREA: 1.0000 | TOPICAL_CREAM | Freq: Every day | VAGINAL | Status: DC

## 2015-08-16 MED ORDER — HYDROXYPROGESTERONE CAPROATE 250 MG/ML IM OIL
250.0000 mg | TOPICAL_OIL | Freq: Once | INTRAMUSCULAR | Status: AC
  Administered 2015-08-16: 250 mg via INTRAMUSCULAR

## 2015-08-16 NOTE — Progress Notes (Signed)
ROB: Patient denies complaints. Notes noncompliance with insulin regimen over past several weeks.  Has missed last appointment with [facility] due to family health issues.  Reiterated importance of tight glucose control.  Patient inquires about water birth.  Advised that due to high risk pregnancy, not typically recommended.  Once risk factors are controlled, may be able to consider hydrotherapy during labor. Given information.  RTC in 4 weeks.  Encouraged to [facility]. Is s/p normal fetal echo.

## 2015-08-19 ENCOUNTER — Encounter: Payer: Self-pay | Admitting: Dietician

## 2015-08-19 NOTE — Progress Notes (Signed)
Have not heard from patient to reschedule missed appointment. Sent discharge letter to MD. 

## 2015-08-23 ENCOUNTER — Ambulatory Visit (INDEPENDENT_AMBULATORY_CARE_PROVIDER_SITE_OTHER): Payer: BLUE CROSS/BLUE SHIELD | Admitting: Obstetrics and Gynecology

## 2015-08-23 VITALS — BP 116/68 | HR 103 | Ht 63.0 in | Wt 255.9 lb

## 2015-08-23 DIAGNOSIS — O09212 Supervision of pregnancy with history of pre-term labor, second trimester: Secondary | ICD-10-CM

## 2015-08-23 MED ORDER — HYDROXYPROGESTERONE CAPROATE 250 MG/ML IM OIL
250.0000 mg | TOPICAL_OIL | Freq: Once | INTRAMUSCULAR | Status: AC
  Administered 2015-08-23: 250 mg via INTRAMUSCULAR

## 2015-08-23 NOTE — Progress Notes (Signed)
Pt presents today for injection of hydroxyprogesterone for hx of PTL. Pt given 1ml of hydroxyprogesterone in the RT gluteal. Pt tolerated well. Pt to f/u in one week as schedule.

## 2015-08-23 NOTE — Patient Instructions (Signed)
F/U as scheduled. 

## 2015-08-25 ENCOUNTER — Inpatient Hospital Stay (HOSPITAL_BASED_OUTPATIENT_CLINIC_OR_DEPARTMENT_OTHER)
Admission: RE | Admit: 2015-08-25 | Discharge: 2015-08-25 | Disposition: A | Discharge status: Home / Self Care | Payer: BLUE CROSS/BLUE SHIELD | Source: Ambulatory Visit

## 2015-08-25 DIAGNOSIS — O24912 Unspecified diabetes mellitus in pregnancy, second trimester: Secondary | ICD-10-CM

## 2015-08-25 NOTE — Progress Notes (Signed)
[facility]: Pt did not keep her appt.   Ki Luckman, [place] A, MD

## 2015-08-30 ENCOUNTER — Ambulatory Visit (INDEPENDENT_AMBULATORY_CARE_PROVIDER_SITE_OTHER): Payer: BLUE CROSS/BLUE SHIELD | Admitting: Obstetrics and Gynecology

## 2015-08-30 VITALS — BP 109/72 | HR 102 | Ht 63.0 in | Wt 259.1 lb

## 2015-08-30 DIAGNOSIS — O09892 Supervision of other high risk pregnancies, second trimester: Secondary | ICD-10-CM

## 2015-08-30 DIAGNOSIS — O09212 Supervision of pregnancy with history of pre-term labor, second trimester: Secondary | ICD-10-CM

## 2015-08-30 MED ORDER — HYDROXYPROGESTERONE CAPROATE 250 MG/ML IM OIL
250.0000 mg | TOPICAL_OIL | Freq: Once | INTRAMUSCULAR | Status: AC
  Administered 2015-08-30: 250 mg via INTRAMUSCULAR

## 2015-08-30 NOTE — Progress Notes (Signed)
Patient ID: Kari Tanner, female   DOB: [birth year], 29 y.o.   MRN: [id number] Pt presents for weekly 17 p inj. Only. See mar. Pt tolerated well. To f/u in 1 week as scheduled.

## 2015-09-04 NOTE — L&D Delivery Note (Signed)
[facility]  Labor Events:   Preterm labor:   Rupture date:   Rupture time:   Rupture type: Intact  Fluid Color:   Induction:   Augmentation:   Complications:   Cervical ripening:          Delivery:   Episiotomy:   Lacerations:   Repair suture:   Repair # of packets:   Blood loss (ml): 700 ml   Information for the patient's newborn:  Kari Tanner, Kari Tanner [[id number]][[id number]]    Delivery 11/14/2015 12:12 PM by  C-Section, Low Vertical Sex:  female Gestational Age: 6377w2d Delivery Clinician:  Hildred LaserAnika Johnette Tanner Living?: Yes        APGARS  One minute Five minutes Ten minutes  Skin color: 0   1      Heart rate: 2   2      Grimace: 2   2      Muscle tone: 2   2      Breathing: 2   2      Totals: 8  9      Presentation/position:      Resuscitation: None  Cord information: 3 vessels   Disposition of cord blood:     Blood gases sent?  Complications:   Placenta: Delivered: 11/14/2015 12:13 PM  Manual removal  Intact appearance Newborn Measurements: Weight: 9 lb 9.8 oz (4360 g)  Height: 20.08"  Head circumference: 36 cm  Chest circumference: 36 cm  Other providers: Obstetrician Transition RN Registered Nurse Registered Nurse Prentice DockerMartin A Defrancesco [name] Shantonette M Payne  Additional  information: Forceps:   Vacuum:   Breech:   Observed anomalies         Please see operative note for details of C-section by Dr. Hildred LaserAnika [name].     Kari LaserAnika Kari Mcfadden, MD [facility]

## 2015-09-06 ENCOUNTER — Ambulatory Visit (INDEPENDENT_AMBULATORY_CARE_PROVIDER_SITE_OTHER): Payer: BLUE CROSS/BLUE SHIELD | Admitting: Obstetrics and Gynecology

## 2015-09-06 VITALS — BP 110/72 | HR 105 | Wt 258.3 lb

## 2015-09-06 DIAGNOSIS — O09212 Supervision of pregnancy with history of pre-term labor, second trimester: Secondary | ICD-10-CM | POA: Diagnosis not present

## 2015-09-06 MED ORDER — HYDROXYPROGESTERONE CAPROATE 250 MG/ML IM OIL
250.0000 mg | TOPICAL_OIL | Freq: Once | INTRAMUSCULAR | Status: AC
  Administered 2015-09-06: 250 mg via INTRAMUSCULAR

## 2015-09-06 NOTE — Progress Notes (Signed)
Pt is here for her hydroxyprogesterone 1ml, pt tolerated injection well

## 2015-09-13 ENCOUNTER — Ambulatory Visit (INDEPENDENT_AMBULATORY_CARE_PROVIDER_SITE_OTHER): Payer: BLUE CROSS/BLUE SHIELD | Admitting: Obstetrics and Gynecology

## 2015-09-13 ENCOUNTER — Encounter: Payer: Self-pay | Admitting: Obstetrics and Gynecology

## 2015-09-13 VITALS — BP 107/69 | HR 108 | Wt 255.7 lb

## 2015-09-13 DIAGNOSIS — O99212 Obesity complicating pregnancy, second trimester: Secondary | ICD-10-CM

## 2015-09-13 DIAGNOSIS — Z3483 Encounter for supervision of other normal pregnancy, third trimester: Secondary | ICD-10-CM

## 2015-09-13 DIAGNOSIS — O09212 Supervision of pregnancy with history of pre-term labor, second trimester: Secondary | ICD-10-CM

## 2015-09-13 DIAGNOSIS — Z23 Encounter for immunization: Secondary | ICD-10-CM

## 2015-09-13 DIAGNOSIS — E669 Obesity, unspecified: Secondary | ICD-10-CM

## 2015-09-13 DIAGNOSIS — O24912 Unspecified diabetes mellitus in pregnancy, second trimester: Secondary | ICD-10-CM

## 2015-09-13 DIAGNOSIS — O24112 Pre-existing diabetes mellitus, type 2, in pregnancy, second trimester: Secondary | ICD-10-CM

## 2015-09-13 DIAGNOSIS — Z3493 Encounter for supervision of normal pregnancy, unspecified, third trimester: Secondary | ICD-10-CM

## 2015-09-13 LAB — POCT URINALYSIS DIPSTICK
Bilirubin, UA: NEGATIVE
Glucose, UA: 4
KETONES UA: NEGATIVE
Leukocytes, UA: NEGATIVE
Nitrite, UA: NEGATIVE
PROTEIN UA: NEGATIVE
RBC UA: NEGATIVE
SPEC GRAV UA: 1.015
UROBILINOGEN UA: 0.2
pH, UA: 6

## 2015-09-13 MED ORDER — TETANUS-DIPHTH-ACELL PERTUSSIS 5-2.5-18.5 LF-MCG/0.5 IM SUSP
0.5000 mL | Freq: Once | INTRAMUSCULAR | Status: AC
  Administered 2015-09-13: 0.5 mL via INTRAMUSCULAR

## 2015-09-13 MED ORDER — HYDROXYPROGESTERONE CAPROATE 250 MG/ML IM OIL
250.0000 mg | TOPICAL_OIL | Freq: Once | INTRAMUSCULAR | Status: AC
  Administered 2015-09-13: 250 mg via INTRAMUSCULAR

## 2015-09-13 NOTE — Progress Notes (Signed)
17p inj and tdap.  Patient at 28.[redacted] weeks gestation.History of uncontrolled insulin dependent diabetes mellitus; patient forgot blood glucose log; blood sugars reportedly averaging greater than 200; patient missed high risk appointment with [facility] Leilani MerlGrotecut [facility].Insulin regimen remains unchanged from 07/26/2015- Recommendations by Dr. Leatha GildingLivingston.  Patient was aggressively counseled regarding the need for better glucose control in order to avoid perinatal morbidity and mortality.  She understands that she needs to get back to see to perinatology this week for ultrasound and reassessment of insulin regimen.  She verbalizes the understanding of bringing her glucose log with her to the appointment. Today, size greater than dates measuring 36 cm, at [redacted] weeks gestation; Leopold's, and fetal heart rate assessment is suggestive of breech presentation. Antepartum testing should be initiated within the next 2 weeks to help validate fetal well-being; We'll accept recommendations from [facility] regarding optimal monitoring. Consider transfer care to tertiary care center due to high risk state and poor glucose regulation. Herold HarmsMartin A Jujuan Dugo, MD

## 2015-09-13 NOTE — Patient Instructions (Signed)
Tdap Vaccine (Tetanus, Diphtheria and Pertussis): What You Need to Know 1. Why get vaccinated? Tetanus, diphtheria and pertussis are very serious diseases. Tdap vaccine can protect us from these diseases. And, Tdap vaccine given to pregnant women can protect newborn babies against pertussis. TETANUS (Lockjaw) is rare in the [place] today. It causes painful muscle tightening and stiffness, usually all over the body.  It can lead to tightening of muscles in the head and neck so you can't open your mouth, swallow, or sometimes even breathe. Tetanus kills about 1 out of 10 people who are infected even after receiving the best medical care. DIPHTHERIA is also rare in the [place] today. It can cause a thick coating to form in the back of the throat.  It can lead to breathing problems, heart failure, paralysis, and death. PERTUSSIS (Whooping Cough) causes severe coughing spells, which can cause difficulty breathing, vomiting and disturbed sleep.  It can also lead to weight loss, incontinence, and rib fractures. Up to 2 in 100 adolescents and 5 in 100 adults with pertussis are hospitalized or have complications, which could include pneumonia or death. These diseases are caused by bacteria. Diphtheria and pertussis are spread from person to person through secretions from coughing or sneezing. Tetanus enters the body through cuts, scratches, or wounds. Before vaccines, as many as 200,000 cases of diphtheria, 200,000 cases of pertussis, and hundreds of cases of tetanus, were reported in the [place] each year. Since vaccination began, reports of cases for tetanus and diphtheria have dropped by about 99% and for pertussis by about 80%. 2. Tdap vaccine Tdap vaccine can protect adolescents and adults from tetanus, diphtheria, and pertussis. One dose of Tdap is routinely given at age 11 or 12. People who did not get Tdap at that age should get it as soon as possible. Tdap is especially important  for healthcare professionals and anyone having close contact with a baby younger than 12 months. Pregnant women should get a dose of Tdap during every pregnancy, to protect the newborn from pertussis. Infants are most at risk for severe, life-threatening complications from pertussis. Another vaccine, called Td, protects against tetanus and diphtheria, but not pertussis. A Td booster should be given every 10 years. Tdap may be given as one of these boosters if you have never gotten Tdap before. Tdap may also be given after a severe cut or burn to prevent tetanus infection. Your doctor or the person giving you the vaccine can give you more information. Tdap may safely be given at the same time as other vaccines. 3. Some people should not get this vaccine  A person who has ever had a life-threatening allergic reaction after a previous dose of any diphtheria, tetanus or pertussis containing vaccine, OR has a severe allergy to any part of this vaccine, should not get Tdap vaccine. Tell the person giving the vaccine about any severe allergies.  Anyone who had coma or long repeated seizures within 7 days after a childhood dose of DTP or DTaP, or a previous dose of Tdap, should not get Tdap, unless a cause other than the vaccine was found. They can still get Td.  Talk to your doctor if you:  have seizures or another nervous system problem,  had severe pain or swelling after any vaccine containing diphtheria, tetanus or pertussis,  ever had a condition called Guillain-Barr Syndrome (GBS),  aren't feeling well on the day the shot is scheduled. 4. Risks With any medicine, including vaccines, there is   a chance of side effects. These are usually mild and go away on their own. Serious reactions are also possible but are rare. Most people who get Tdap vaccine do not have any problems with it. Mild problems following Tdap (Did not interfere with activities)  Pain where the shot was given (about 3 in 4  adolescents or 2 in 3 adults)  Redness or swelling where the shot was given (about 1 person in 5)  Mild fever of at least 100.4F (up to about 1 in 25 adolescents or 1 in 100 adults)  Headache (about 3 or 4 people in 10)  Tiredness (about 1 person in 3 or 4)  Nausea, vomiting, diarrhea, stomach ache (up to 1 in 4 adolescents or 1 in 10 adults)  Chills, sore joints (about 1 person in 10)  Body aches (about 1 person in 3 or 4)  Rash, swollen glands (uncommon) Moderate problems following Tdap (Interfered with activities, but did not require medical attention)  Pain where the shot was given (up to 1 in 5 or 6)  Redness or swelling where the shot was given (up to about 1 in 16 adolescents or 1 in 12 adults)  Fever over 102F (about 1 in 100 adolescents or 1 in 250 adults)  Headache (about 1 in 7 adolescents or 1 in 10 adults)  Nausea, vomiting, diarrhea, stomach ache (up to 1 or 3 people in 100)  Swelling of the entire arm where the shot was given (up to about 1 in 500). Severe problems following Tdap (Unable to perform usual activities; required medical attention)  Swelling, severe pain, bleeding and redness in the arm where the shot was given (rare). Problems that could happen after any vaccine:  People sometimes faint after a medical procedure, including vaccination. Sitting or lying down for about 15 minutes can help prevent fainting, and injuries caused by a fall. Tell your doctor if you feel dizzy, or have vision changes or ringing in the ears.  Some people get severe pain in the shoulder and have difficulty moving the arm where a shot was given. This happens very rarely.  Any medication can cause a severe allergic reaction. Such reactions from a vaccine are very rare, estimated at fewer than 1 in a million doses, and would happen within a few minutes to a few hours after the vaccination. As with any medicine, there is a very remote chance of a vaccine causing a serious  injury or death. The safety of vaccines is always being monitored. For more information, visit: www.cdc.gov/vaccinesafety/ 5. What if there is a serious problem? What should I look for?  Look for anything that concerns you, such as signs of a severe allergic reaction, very high fever, or unusual behavior.  Signs of a severe allergic reaction can include hives, swelling of the face and throat, difficulty breathing, a fast heartbeat, dizziness, and weakness. These would usually start a few minutes to a few hours after the vaccination. What should I do?  If you think it is a severe allergic reaction or other emergency that can't wait, call 9-1-1 or get the person to the nearest hospital. Otherwise, call your doctor.  Afterward, the reaction should be reported to the Vaccine Adverse Event Reporting System (VAERS). Your doctor might file this report, or you can do it yourself through the VAERS web site at www.vaers.hhs.gov, or by calling 1-800-822-7967. VAERS does not give medical advice.  6. The National Vaccine Injury Compensation Program The National Vaccine Injury Compensation Program (  VICP) is a federal program that was created to compensate people who may have been injured by certain vaccines. Persons who believe they may have been injured by a vaccine can learn about the program and about filing a claim by calling 1-800-338-2382 or visiting the VICP website at www.hrsa.gov/vaccinecompensation. There is a time limit to file a claim for compensation. 7. How can I learn more?  Ask your doctor. He or she can give you the vaccine package insert or suggest other sources of information.  Call your local or state health department.  Contact the Centers for Disease Control and Prevention (CDC):  Call 1-800-232-4636 (1-800-CDC-INFO) or  Visit CDC's website at www.cdc.gov/vaccines CDC Tdap Vaccine VIS (10/27/13)   This information is not intended to replace advice given to you by your health care  provider. Make sure you discuss any questions you have with your health care provider.   Document Released: 02/19/2012 Document Revised: 09/10/2014 Document Reviewed: 12/02/2013 Elsevier Interactive Patient Education 2016 Elsevier Inc.  

## 2015-09-16 ENCOUNTER — Telehealth: Payer: Self-pay | Admitting: Obstetrics and Gynecology

## 2015-09-16 NOTE — Telephone Encounter (Signed)
Dr Tommi Rumpse I called [facility] in tueday 1/10 [facility] and she said she would call back with an appt for this pt and I didn't get a call back so I called again thursday and talked to CasarJessica. She said because of Christmas, New Years , the snow, and [place] Day the office was closed. She said hard getting pt in and will call back with an appt. Just wanted you to know where things were at with her. I stressed how important it was for her to get in asap.

## 2015-09-20 ENCOUNTER — Ambulatory Visit (INDEPENDENT_AMBULATORY_CARE_PROVIDER_SITE_OTHER): Payer: BLUE CROSS/BLUE SHIELD | Admitting: Obstetrics and Gynecology

## 2015-09-20 VITALS — BP 106/76 | HR 116 | Wt 258.1 lb

## 2015-09-20 DIAGNOSIS — O09212 Supervision of pregnancy with history of pre-term labor, second trimester: Secondary | ICD-10-CM

## 2015-09-20 DIAGNOSIS — R350 Frequency of micturition: Secondary | ICD-10-CM

## 2015-09-20 DIAGNOSIS — Z36 Encounter for antenatal screening of mother: Secondary | ICD-10-CM

## 2015-09-20 DIAGNOSIS — R3915 Urgency of urination: Secondary | ICD-10-CM | POA: Diagnosis not present

## 2015-09-20 DIAGNOSIS — Z1389 Encounter for screening for other disorder: Secondary | ICD-10-CM | POA: Diagnosis not present

## 2015-09-20 DIAGNOSIS — Z369 Encounter for antenatal screening, unspecified: Secondary | ICD-10-CM

## 2015-09-20 LAB — POCT URINALYSIS DIPSTICK
Bilirubin, UA: NEGATIVE
Ketones, UA: NEGATIVE
Leukocytes, UA: NEGATIVE
Nitrite, UA: NEGATIVE
PH UA: 6.5
PROTEIN UA: NEGATIVE
RBC UA: NEGATIVE
SPEC GRAV UA: 1.02
UROBILINOGEN UA: NEGATIVE

## 2015-09-20 MED ORDER — NITROFURANTOIN MONOHYD MACRO 100 MG PO CAPS: 100.0000 mg | ORAL_CAPSULE | Freq: Two times a day (BID) | ORAL | Status: DC

## 2015-09-20 MED ORDER — HYDROXYPROGESTERONE CAPROATE 250 MG/ML IM OIL
250.0000 mg | TOPICAL_OIL | Freq: Once | INTRAMUSCULAR | Status: AC
  Administered 2015-09-20: 250 mg via INTRAMUSCULAR

## 2015-09-20 NOTE — Progress Notes (Signed)
Patient ID: Kari Tanner, female   DOB: 02-01-1986, 30 y.o.   MRN: [id number] Pt presents for hydroxyprogesterone injection. No c/o contractions or bleeding. Has urinary urgency and frequency. Urinalysis done which was unremarkable except for 4+ glucose and urine culture sent. Rx for Macrobid sent to pharmacy due to pt being symptomatic.

## 2015-09-20 NOTE — Telephone Encounter (Signed)
LM WITH PT LETTING HER KNOW TO CALL ME SO WE CAN SCHEDULE AN [place] THIS WEEK FOR GROWTH SCAN

## 2015-09-20 NOTE — Telephone Encounter (Signed)
Can we see if we can get this patient scheduled for a growth ultrasound sometime this week, preferrably tomorrow or early Thursday morning.  I do believe that she has an appointment with [facility] on Thursday (however due to crazy scheduling, they cannot perform scan on same day as visit, and would be booked out for at least another week due to the snow day last week).  Please confirm with patient that she has an appointment with [facility].

## 2015-09-23 ENCOUNTER — Ambulatory Visit (INDEPENDENT_AMBULATORY_CARE_PROVIDER_SITE_OTHER): Payer: BLUE CROSS/BLUE SHIELD

## 2015-09-23 DIAGNOSIS — Z3492 Encounter for supervision of normal pregnancy, unspecified, second trimester: Secondary | ICD-10-CM

## 2015-09-23 LAB — CULTURE, OB URINE

## 2015-09-23 LAB — URINE CULTURE, OB REFLEX

## 2015-09-26 ENCOUNTER — Ambulatory Visit: Payer: BLUE CROSS/BLUE SHIELD

## 2015-09-26 ENCOUNTER — Ambulatory Visit
Admission: RE | Admit: 2015-09-26 | Discharge: 2015-09-26 | Disposition: A | Discharge status: Home / Self Care | Payer: BLUE CROSS/BLUE SHIELD | Source: Ambulatory Visit | Attending: Obstetrics & Gynecology | Admitting: Obstetrics & Gynecology

## 2015-09-26 VITALS — BP 107/69 | HR 117 | Temp 97.7°F | Wt 257.0 lb

## 2015-09-26 DIAGNOSIS — E669 Obesity, unspecified: Secondary | ICD-10-CM

## 2015-09-26 DIAGNOSIS — O99213 Obesity complicating pregnancy, third trimester: Secondary | ICD-10-CM | POA: Diagnosis not present

## 2015-09-26 DIAGNOSIS — O24912 Unspecified diabetes mellitus in pregnancy, second trimester: Secondary | ICD-10-CM

## 2015-09-26 MED ORDER — METFORMIN HCL 500 MG PO TABS: 500.0000 mg | ORAL_TABLET | Freq: Two times a day (BID) | ORAL | Status: DC

## 2015-09-26 NOTE — Progress Notes (Signed)
[facility] Return Consult Visit: Geri returns for followup MFM consultation in setting of type II diabetes complicating pregnancy. She has missed recent appointments with [place]. Anastazia is a 30 year-old G6 P1223 at 52 2/7 weeks (EDC 12/03/15) who returns to evaluate her glycemic control. She had a normal fetal echo. Her detailed ultrasound performed at 20 weeks at [facility] demonstrated normal fetal anatomy but the spine was suboptimally visualized. She has had followup growth scans at [facility].  She is currently taking Lantus 44 Units at bedtime and Humalog 08/15/11 with her meals. She states she misses insulin doses and is only checking about one accucheck a day.  She has no complaints. She was seen by [facility]. Despite that she says that she gets cravings for fast food and goes to [employer] frequently, often for a second meal after she has had a meal at home..  Exam: Filed Vitals:   09/26/15 1421  BP: 107/69  Pulse: 117  Temp: 97.7 F (36.5 C)   Sugar log (Jan 12-22): FS: 167, 167, 141, 154, 152, 148, 148  2hr PP Breakfast: 227, 182 2hr PP lunch: 120, 129 2hr PP dinner: 173, 151, 152, 190  [place] (09/23/15, [facility]): EFW 1858 (73%) at 30 weeks, AC measures 4 weeks ahead, AFI 24 cm, cephalic (images not reviewed)  Labs: A1C (07/21/15): 7.1%  Assessment and Recommendations:  Ms. Haughey is a 30 year-old G6 P1223 at 3 2/7 weeks here for f/u visit in setting of poorly controlled type II diabetes in pregnancy. She is not checking her sugars regularly, reports not following her diet and states she missed insulin doses now and then. Recent US performed at [facility] demonstrates signs of fetopathy with polyhydramnios and an AC measuring great than 4 weeks. She is on a relatively high dose of insulin and given that she is not taking it regularly taking it, I recommend starting metformin. This will provide some coverage throughout the day and hopefully make the insulin that she is  taking work better.  I had a long discussion with her concerning the importance of checking her sugars regularly (four times a day), following her diet and taking her medications.  -Start Metformin 500 mg twice daily. Continue insulin regimen as above (Lantus 44, Humalog 08/15/11) -Check an A1C and CBC at next [facility] visit -Start twice-weekly testing at 32 weeks -Recommend delivery at 37 weeks given signs of fetopathy (polyhdramnios and AC is four weeks ahead) -Return to [facility] in one week with complete log so that we can adequately adjust insulin  Roddy Bellamy, [place] A, MD

## 2015-09-27 ENCOUNTER — Other Ambulatory Visit: Payer: Self-pay

## 2015-09-27 ENCOUNTER — Other Ambulatory Visit (INDEPENDENT_AMBULATORY_CARE_PROVIDER_SITE_OTHER): Payer: BLUE CROSS/BLUE SHIELD

## 2015-09-27 ENCOUNTER — Ambulatory Visit (INDEPENDENT_AMBULATORY_CARE_PROVIDER_SITE_OTHER): Payer: BLUE CROSS/BLUE SHIELD | Admitting: Obstetrics and Gynecology

## 2015-09-27 VITALS — BP 104/71 | HR 104 | Wt 257.3 lb

## 2015-09-27 DIAGNOSIS — O24912 Unspecified diabetes mellitus in pregnancy, second trimester: Secondary | ICD-10-CM

## 2015-09-27 DIAGNOSIS — Z36 Encounter for antenatal screening of mother: Secondary | ICD-10-CM | POA: Diagnosis not present

## 2015-09-27 DIAGNOSIS — O403XX0 Polyhydramnios, third trimester, not applicable or unspecified: Secondary | ICD-10-CM

## 2015-09-27 DIAGNOSIS — Z8751 Personal history of pre-term labor: Secondary | ICD-10-CM

## 2015-09-27 DIAGNOSIS — O24414 Gestational diabetes mellitus in pregnancy, insulin controlled: Secondary | ICD-10-CM

## 2015-09-27 DIAGNOSIS — Z1389 Encounter for screening for other disorder: Secondary | ICD-10-CM

## 2015-09-27 DIAGNOSIS — Z369 Encounter for antenatal screening, unspecified: Secondary | ICD-10-CM

## 2015-09-27 LAB — POCT URINALYSIS DIPSTICK
BILIRUBIN UA: NEGATIVE
Blood, UA: NEGATIVE
Leukocytes, UA: NEGATIVE
Nitrite, UA: NEGATIVE
SPEC GRAV UA: 1.025
Urobilinogen, UA: NEGATIVE
pH, UA: 6

## 2015-09-27 MED ORDER — HYDROXYPROGESTERONE CAPROATE 250 MG/ML IM OIL
250.0000 mg | TOPICAL_OIL | Freq: Once | INTRAMUSCULAR | Status: AC
  Administered 2015-09-27: 250 mg via INTRAMUSCULAR

## 2015-09-28 DIAGNOSIS — O409XX0 Polyhydramnios, unspecified trimester, not applicable or unspecified: Secondary | ICD-10-CM | POA: Insufficient documentation

## 2015-09-28 NOTE — Progress Notes (Signed)
ROB: Patient reports recent visit with [facility] this week.  Is currently on Lantus, Humalog, and was initiated on Metformin (just started taking today).  Will be following again next week. Growth scan performed last Friday with growth at 73%ile, and polyhydramnios present (AFI 24 cm).  Discussed plan for delivery at 37 weeks based on [facility] recommendations, as well as risks vs benefits of early delivery.  Will repeat AFI and begin antenatal testing at 32 weeks.

## 2015-10-03 ENCOUNTER — Ambulatory Visit
Admission: RE | Admit: 2015-10-03 | Discharge: 2015-10-03 | Disposition: A | Discharge status: Home / Self Care | Payer: BLUE CROSS/BLUE SHIELD | Source: Ambulatory Visit | Attending: Obstetrics & Gynecology | Admitting: Obstetrics & Gynecology

## 2015-10-03 VITALS — BP 127/69 | HR 108 | Temp 99.1°F | Wt 257.0 lb

## 2015-10-03 DIAGNOSIS — O09893 Supervision of other high risk pregnancies, third trimester: Secondary | ICD-10-CM

## 2015-10-03 DIAGNOSIS — O24913 Unspecified diabetes mellitus in pregnancy, third trimester: Secondary | ICD-10-CM

## 2015-10-03 DIAGNOSIS — O403XX0 Polyhydramnios, third trimester, not applicable or unspecified: Secondary | ICD-10-CM

## 2015-10-03 DIAGNOSIS — O99213 Obesity complicating pregnancy, third trimester: Secondary | ICD-10-CM | POA: Diagnosis not present

## 2015-10-03 DIAGNOSIS — E669 Obesity, unspecified: Secondary | ICD-10-CM

## 2015-10-03 DIAGNOSIS — O09213 Supervision of pregnancy with history of pre-term labor, third trimester: Secondary | ICD-10-CM | POA: Diagnosis not present

## 2015-10-03 NOTE — Addendum Note (Signed)
Encounter addended by: [place] Raekwan Spelman, MD on: 10/03/2015 12:51 PM<BR>     Documentation filed: Notes Section

## 2015-10-03 NOTE — Progress Notes (Addendum)
[facility] Return Consult Note: Kari Tanner returns to review her sugar log. She is on Lantus 44 Units and Humalog 08/15/11 and was started on Metformin last week (500 mg q12 hrs).  See consult dated 09/26/15 as well as notes from [place] on 07/21/15 and 07/11/15.  [name] states that she is eating fast-food a bit less than prior but still gets "urges" and made trips to [employer] and [employer] this past week. She started her metformin and has done well on it. No nausea or vomiting. She has had some loose stools but it is improving.  Exam: Filed Vitals:   10/03/15 1212  BP: 127/69  Pulse: 108  Temp: 99.1 F (37.3 C)    Accuchecks (09/27/15-10/02/15): Fasting: 150, 121, 126, 112, 118, 121, 112 2hr PP breakfast: 188, 178, 154, 148, 90, 208 (oatmeal and toast) 2hr PP lunch: 200 ([employer]), 185, 132, 190, 193, 210 ([employer]) 2hr PP dinner: 119, 161, 120, 135, 145, 115  Assesment and Recs:  [name] is a 30 year-old G6 P1223 at 30 2/7 weeks here for f/u visit in setting of poorly controlled type II diabetes in pregnancy.  We started her on Metformin 500 mg BID last week to add to her insulin (Lantus 44 Units bedtime, Humalog 08/15/11) as she is on a relatively high dose of insulin and she was not taking her insulin regularly, nor was she checking her sugars regularly.  She has done a great job checking her sugars four-times daily over the last week.  Her values are still too high but fasting are getting better.   -Increase metformin to 1000 mg twice daily -Continue Insulin at Lantus 44 Units at bedtime and Humalog 08/15/11 -Try to improve diet and avoid fast-food.  We will likley need to increase her insulin at her next visit but will see how she does with the increased dose of metformin fist -Twice weekly testing at [facility] -Delivery at 37 weeks (no amnio) given evidence of fetaopathy (poly and AC four weeks ahead) -RTC 1 wk to review log and make insulin adjustments -Please check A1C at  next [facility] visit -See consult notes dated 09/26/15, 07/21/15 and 07/11/15  Gennie Eisinger, [place] A, MD

## 2015-10-04 ENCOUNTER — Ambulatory Visit (INDEPENDENT_AMBULATORY_CARE_PROVIDER_SITE_OTHER): Payer: BLUE CROSS/BLUE SHIELD | Admitting: Obstetrics and Gynecology

## 2015-10-04 VITALS — BP 109/82 | HR 117 | Wt 258.2 lb

## 2015-10-04 DIAGNOSIS — Z8751 Personal history of pre-term labor: Secondary | ICD-10-CM

## 2015-10-04 MED ORDER — HYDROXYPROGESTERONE CAPROATE 250 MG/ML IM OIL
250.0000 mg | TOPICAL_OIL | Freq: Once | INTRAMUSCULAR | Status: AC
  Administered 2015-10-04: 250 mg via INTRAMUSCULAR

## 2015-10-04 NOTE — Progress Notes (Signed)
Patient ID: Kari Tanner, female   DOB: 1986-08-02, 30 y.o.   MRN: [id number] Pt here for hydroxyprogesterone 17p injection for H/O PTL. Pt states she is having braxton hicks contractions at times. Also pt states she needs to travel to [place] next Tuesday evening due to her great-great grandmother passing. She is rescheduling her appt to Tuesday am.

## 2015-10-05 ENCOUNTER — Other Ambulatory Visit: Payer: Self-pay | Admitting: Obstetrics and Gynecology

## 2015-10-05 DIAGNOSIS — O24419 Gestational diabetes mellitus in pregnancy, unspecified control: Secondary | ICD-10-CM

## 2015-10-05 DIAGNOSIS — Z0374 Encounter for suspected problem with fetal growth ruled out: Secondary | ICD-10-CM

## 2015-10-07 ENCOUNTER — Ambulatory Visit (INDEPENDENT_AMBULATORY_CARE_PROVIDER_SITE_OTHER): Payer: BLUE CROSS/BLUE SHIELD

## 2015-10-07 ENCOUNTER — Other Ambulatory Visit: Payer: BLUE CROSS/BLUE SHIELD

## 2015-10-07 DIAGNOSIS — Z0374 Encounter for suspected problem with fetal growth ruled out: Secondary | ICD-10-CM

## 2015-10-07 DIAGNOSIS — O24419 Gestational diabetes mellitus in pregnancy, unspecified control: Secondary | ICD-10-CM

## 2015-10-10 ENCOUNTER — Ambulatory Visit: Payer: BLUE CROSS/BLUE SHIELD

## 2015-10-11 ENCOUNTER — Other Ambulatory Visit: Payer: BLUE CROSS/BLUE SHIELD

## 2015-10-11 ENCOUNTER — Encounter: Payer: BLUE CROSS/BLUE SHIELD | Admitting: Obstetrics and Gynecology

## 2015-10-11 ENCOUNTER — Observation Stay
Admission: EM | Admit: 2015-10-11 | Discharge: 2015-10-11 | Disposition: A | Discharge status: Home / Self Care | Payer: Medicaid Other | Attending: Obstetrics and Gynecology | Admitting: Obstetrics and Gynecology

## 2015-10-11 ENCOUNTER — Ambulatory Visit (INDEPENDENT_AMBULATORY_CARE_PROVIDER_SITE_OTHER): Payer: BLUE CROSS/BLUE SHIELD | Admitting: Obstetrics and Gynecology

## 2015-10-11 VITALS — BP 111/72 | HR 99 | Wt 261.6 lb

## 2015-10-11 DIAGNOSIS — M7989 Other specified soft tissue disorders: Secondary | ICD-10-CM | POA: Insufficient documentation

## 2015-10-11 DIAGNOSIS — Z794 Long term (current) use of insulin: Secondary | ICD-10-CM | POA: Insufficient documentation

## 2015-10-11 DIAGNOSIS — O0993 Supervision of high risk pregnancy, unspecified, third trimester: Secondary | ICD-10-CM

## 2015-10-11 DIAGNOSIS — O24414 Gestational diabetes mellitus in pregnancy, insulin controlled: Secondary | ICD-10-CM

## 2015-10-11 DIAGNOSIS — O09213 Supervision of pregnancy with history of pre-term labor, third trimester: Secondary | ICD-10-CM

## 2015-10-11 DIAGNOSIS — O09893 Supervision of other high risk pregnancies, third trimester: Secondary | ICD-10-CM

## 2015-10-11 LAB — POCT URINALYSIS DIPSTICK
Bilirubin, UA: NEGATIVE
Glucose, UA: 2000
Ketones, UA: NEGATIVE
Leukocytes, UA: NEGATIVE
Nitrite, UA: NEGATIVE
PH UA: 6
RBC UA: NEGATIVE
SPEC GRAV UA: 1.02
UROBILINOGEN UA: NEGATIVE

## 2015-10-11 MED ORDER — HYDROXYPROGESTERONE CAPROATE 250 MG/ML IM OIL
250.0000 mg | TOPICAL_OIL | Freq: Once | INTRAMUSCULAR | Status: AC
  Administered 2015-10-11: 250 mg via INTRAMUSCULAR

## 2015-10-11 NOTE — Progress Notes (Signed)
ROB: Notes feeling tired.  Increased to metformin 1000 mg BID last week .  Currently still on insulin regimen of  Lantus 44 Units and Humalog 08/15/11.  Most recent ultrasound now noting normal AFI (15 cm), previously polyhydramnios (AFI 24 cm).  Patient denies h/o leaking fluid. Growth wnl. Complains of bilateral foot swelling. +1 edema noted, advised on Ted hose. Still recommended that patient be delivered at 37 weeks.  To begin antenatal testing.  Attempted NST in office but unable to perform difficult to patient's body habitus.  Sent to L&D where she will receive twice weekly NSTs.

## 2015-10-14 ENCOUNTER — Encounter: Payer: Self-pay | Admitting: *Deleted

## 2015-10-14 ENCOUNTER — Other Ambulatory Visit: Payer: BLUE CROSS/BLUE SHIELD

## 2015-10-14 ENCOUNTER — Observation Stay
Admission: RE | Admit: 2015-10-14 | Discharge: 2015-10-14 | Disposition: A | Discharge status: Home / Self Care | Payer: Medicaid Other | Attending: Obstetrics and Gynecology | Admitting: Obstetrics and Gynecology

## 2015-10-14 DIAGNOSIS — O24419 Gestational diabetes mellitus in pregnancy, unspecified control: Secondary | ICD-10-CM | POA: Diagnosis present

## 2015-10-14 DIAGNOSIS — Z3A32 32 weeks gestation of pregnancy: Secondary | ICD-10-CM | POA: Insufficient documentation

## 2015-10-14 DIAGNOSIS — O24414 Gestational diabetes mellitus in pregnancy, insulin controlled: Secondary | ICD-10-CM | POA: Diagnosis present

## 2015-10-14 NOTE — Final Progress Note (Signed)
L&D OB Triage Note  Kari Tanner is a 30 y.o. [id number] female at [redacted]w[redacted]d, EDD Estimated Date of Delivery: 12/03/15 who presented to triage for scheduled NST for h/o GDM Class A2 on oral medication and insulin, and h/o preterm deliveries.  She denied complaints. Vital signs stable. An NST was performed and has been reviewed by MD.    NST INTERPRETATION: Indications: gestational diabetes mellitus  Mode: External Baseline Rate (A): 140 bpm Variability: Moderate Accelerations: 15 x 15 Decelerations: None Nonstress Test Interpretation: Reactive Overall Impression: Reassuring for gestational age Contraction Frequency (min): Ocas  Impression: reactive   Plan: NST performed was reviewed and was found to be reactive.   Continue routine prenatal care and twice weekly NSTs. Follow up with OB/GYN as previously scheduled.     Hildred Laser, MD

## 2015-10-14 NOTE — Plan of Care (Signed)
Discussed diabetic diet encouraged to abide by diet for her and her baby's protection.  Verbalized doubt in being able to abide by diet.  States that she is trying to limit the number of carbs in her diet but she craves cereal.  Encouraged to seek help and be consistent in her provider visits.  Discharge teaching done. Verbalized understanding. DCd ambulatory with support person at side.  Will return 2/14 at 0900 for NST.

## 2015-10-14 NOTE — OB Triage Note (Signed)
Scheduled NST for uncontrolled GDM, insulin and Metformin regimen. Reports no tfollowing Carb control diet.

## 2015-10-17 ENCOUNTER — Inpatient Hospital Stay (HOSPITAL_BASED_OUTPATIENT_CLINIC_OR_DEPARTMENT_OTHER)
Admission: RE | Admit: 2015-10-17 | Discharge: 2015-10-17 | Disposition: A | Discharge status: Home / Self Care | Payer: Medicaid Other | Source: Ambulatory Visit

## 2015-10-17 DIAGNOSIS — O09213 Supervision of pregnancy with history of pre-term labor, third trimester: Secondary | ICD-10-CM

## 2015-10-17 DIAGNOSIS — O99213 Obesity complicating pregnancy, third trimester: Secondary | ICD-10-CM

## 2015-10-17 DIAGNOSIS — O09893 Supervision of other high risk pregnancies, third trimester: Secondary | ICD-10-CM

## 2015-10-17 DIAGNOSIS — E669 Obesity, unspecified: Secondary | ICD-10-CM

## 2015-10-17 DIAGNOSIS — O24913 Unspecified diabetes mellitus in pregnancy, third trimester: Secondary | ICD-10-CM

## 2015-10-17 NOTE — Progress Notes (Signed)
[facility] Felt: Pt did not show for her appointment. Will have it rescheduled.  Kari Tanner, [place] A, MD

## 2015-10-18 ENCOUNTER — Ambulatory Visit (INDEPENDENT_AMBULATORY_CARE_PROVIDER_SITE_OTHER): Payer: Medicaid Other | Admitting: Obstetrics and Gynecology

## 2015-10-18 ENCOUNTER — Inpatient Hospital Stay
Admission: RE | Admit: 2015-10-18 | Discharge: 2015-10-18 | Disposition: A | Discharge status: Home / Self Care | Payer: Medicaid Other | Attending: Obstetrics and Gynecology | Admitting: Obstetrics and Gynecology

## 2015-10-18 VITALS — BP 109/74 | HR 109 | Wt 257.3 lb

## 2015-10-18 DIAGNOSIS — O09893 Supervision of other high risk pregnancies, third trimester: Secondary | ICD-10-CM

## 2015-10-18 DIAGNOSIS — Z3493 Encounter for supervision of normal pregnancy, unspecified, third trimester: Secondary | ICD-10-CM | POA: Diagnosis present

## 2015-10-18 DIAGNOSIS — Z3A35 35 weeks gestation of pregnancy: Secondary | ICD-10-CM | POA: Diagnosis not present

## 2015-10-18 DIAGNOSIS — O09213 Supervision of pregnancy with history of pre-term labor, third trimester: Secondary | ICD-10-CM

## 2015-10-18 MED ORDER — HYDROXYPROGESTERONE CAPROATE 250 MG/ML IM OIL
250.0000 mg | TOPICAL_OIL | Freq: Once | INTRAMUSCULAR | Status: AC
  Administered 2015-10-18: 250 mg via INTRAMUSCULAR

## 2015-10-18 NOTE — Discharge Summary (Signed)
Discharge instructions reviewed with patient including follow up appointments and when to seek medical attention. All questions answered. Patient discharged home, ambulatory in steady condition, escorted by family member.

## 2015-10-18 NOTE — Progress Notes (Signed)
Patient ID: Kari Tanner, female   DOB: 1986/08/15, 30 y.o.   MRN: [id number] Pt presents for 17p medroxyprogesterone injection. Having a little pressure, no vaginal bleeding, contractions. Pt questioned if she needs to continue her NST's twice and per Dr. Valentino Saxon twice a week NST's are to continue. Pt notified. Pt also was at the hospital this am for her NST and they checked her per pt because she wasn't sure if she was leaking fluid or urine. Amt between panti liner and regular pad and changes 2xd. Fern negative.

## 2015-10-21 ENCOUNTER — Observation Stay
Admission: RE | Admit: 2015-10-21 | Discharge: 2015-10-21 | Disposition: A | Discharge status: Home / Self Care | Payer: Medicaid Other | Attending: Obstetrics and Gynecology | Admitting: Obstetrics and Gynecology

## 2015-10-21 ENCOUNTER — Encounter: Payer: Self-pay | Admitting: *Deleted

## 2015-10-21 DIAGNOSIS — O24414 Gestational diabetes mellitus in pregnancy, insulin controlled: Secondary | ICD-10-CM | POA: Diagnosis present

## 2015-10-21 DIAGNOSIS — Z3A33 33 weeks gestation of pregnancy: Secondary | ICD-10-CM | POA: Diagnosis not present

## 2015-10-21 DIAGNOSIS — O24419 Gestational diabetes mellitus in pregnancy, unspecified control: Secondary | ICD-10-CM | POA: Diagnosis present

## 2015-10-21 NOTE — Final Progress Note (Signed)
L&D OB Triage Note  Kari Tanner is a 30 y.o. [id number] female at [redacted]w[redacted]d , EDD Estimated Date of Delivery: 12/03/15 who presented to triage for scheduled NST for h/o GDM Class A2 on oral medication and insulin, and h/o preterm deliveries.  She denied complaints. Vital signs stable. An NST was performed and has been reviewed by MD.    NST INTERPRETATION: Indications: gestational diabetes mellitus  Mode: External Baseline Rate (A): 145 bpm Variability: Moderate Accelerations: 15 x 15 Decelerations: None     Contraction Frequency (min): occasional  Impression: reactive   Plan: NST performed was reviewed and was found to be reactive.   Continue routine prenatal care and twice weekly NSTs. Follow up with OB/GYN as previously scheduled.     Hildred Laser, MD

## 2015-10-21 NOTE — Discharge Summary (Signed)
Reviewed discharge instructions with patient and significant other, including signs of preterm labor, PROM, decreased fetal movement, and vaginal bleeding. Copy of instructions given to patient. Stable and ambulatory on discharge.

## 2015-10-21 NOTE — OB Triage Note (Signed)
NST for GDM, bi-weekly.

## 2015-10-25 ENCOUNTER — Ambulatory Visit (INDEPENDENT_AMBULATORY_CARE_PROVIDER_SITE_OTHER): Payer: Medicaid Other

## 2015-10-25 ENCOUNTER — Ambulatory Visit (INDEPENDENT_AMBULATORY_CARE_PROVIDER_SITE_OTHER): Payer: Medicaid Other | Admitting: Obstetrics and Gynecology

## 2015-10-25 ENCOUNTER — Inpatient Hospital Stay
Admission: RE | Admit: 2015-10-25 | Discharge: 2015-10-25 | Disposition: A | Discharge status: Home / Self Care | Payer: Medicaid Other | Attending: Obstetrics and Gynecology | Admitting: Obstetrics and Gynecology

## 2015-10-25 VITALS — BP 116/75 | HR 105 | Wt 263.2 lb

## 2015-10-25 DIAGNOSIS — Z3493 Encounter for supervision of normal pregnancy, unspecified, third trimester: Secondary | ICD-10-CM | POA: Insufficient documentation

## 2015-10-25 DIAGNOSIS — Z3A35 35 weeks gestation of pregnancy: Secondary | ICD-10-CM | POA: Diagnosis not present

## 2015-10-25 DIAGNOSIS — O24414 Gestational diabetes mellitus in pregnancy, insulin controlled: Secondary | ICD-10-CM

## 2015-10-25 DIAGNOSIS — O0993 Supervision of high risk pregnancy, unspecified, third trimester: Secondary | ICD-10-CM

## 2015-10-25 DIAGNOSIS — O09893 Supervision of other high risk pregnancies, third trimester: Secondary | ICD-10-CM

## 2015-10-25 DIAGNOSIS — O09213 Supervision of pregnancy with history of pre-term labor, third trimester: Secondary | ICD-10-CM | POA: Diagnosis not present

## 2015-10-25 DIAGNOSIS — O99213 Obesity complicating pregnancy, third trimester: Secondary | ICD-10-CM

## 2015-10-25 DIAGNOSIS — E669 Obesity, unspecified: Secondary | ICD-10-CM

## 2015-10-25 LAB — POCT URINALYSIS DIPSTICK
BILIRUBIN UA: NEGATIVE
Blood, UA: NEGATIVE
GLUCOSE UA: 2000
Ketones, UA: NEGATIVE
LEUKOCYTES UA: NEGATIVE
NITRITE UA: NEGATIVE
PH UA: 6.5
Spec Grav, UA: 1.02
Urobilinogen, UA: 0.2

## 2015-10-25 MED ORDER — HYDROXYPROGESTERONE CAPROATE 250 MG/ML IM OIL
250.0000 mg | TOPICAL_OIL | Freq: Once | INTRAMUSCULAR | Status: AC
  Administered 2015-10-25: 250 mg via INTRAMUSCULAR

## 2015-10-25 NOTE — OB Triage Note (Signed)
Pt arrived for biweekly NST. Placed on monitor and will cont to monitor.

## 2015-10-25 NOTE — Progress Notes (Signed)
ROB:  Notes feeling tired. Missed last [facility] appointment due to feeling sick. Notes calling to reschedule but has not heard anything back.  Reports new insulin/metformin regimen with fastings 70-120s (improvement from 180s), postprandials 120s-180s (however ate pancakes).  Notes some evenings she does not eat dinner (falls asleep early)  But still takes her medications and wakes up with blood sugars in low 70s.  Advised on only taking metformin at night, but holding insulin if not eating (or halving evening insulin dose if blood sugars still noted to be elevated.  Discussed plan for delivery at 37 weeks (IOL vs repeat C-section).  To determine closer to time based on cervical exams.  If favorable, can induce with foley bulb/pitocin.  If unfavorable, would recommend repeat C-section. Continue twice weekly NSTs.  Ultrasound today with growth currently 6 lb 2 oz,  (less than 90%)  AFI normal 16.2 cm. RTC in 2 weeks.

## 2015-10-25 NOTE — Progress Notes (Signed)
Pt with a reactive NST, Dr. Valentino Saxon notified and ok with pt being d/c'd home. Next NST scheduled for 10/28/15 at 0900. Pt aware.

## 2015-11-01 ENCOUNTER — Ambulatory Visit: Payer: BLUE CROSS/BLUE SHIELD

## 2015-11-01 ENCOUNTER — Telehealth: Payer: Self-pay

## 2015-11-01 NOTE — Telephone Encounter (Signed)
Left message for pt to contact office. Pt missed her appt. Today for her hydroxyprogesterone 17p injection.  I called pt's place of work and she no longer works there.

## 2015-11-02 ENCOUNTER — Ambulatory Visit (INDEPENDENT_AMBULATORY_CARE_PROVIDER_SITE_OTHER): Payer: Medicaid Other | Admitting: Obstetrics and Gynecology

## 2015-11-02 VITALS — BP 112/82 | HR 107 | Wt 268.3 lb

## 2015-11-02 DIAGNOSIS — O09893 Supervision of other high risk pregnancies, third trimester: Secondary | ICD-10-CM

## 2015-11-02 DIAGNOSIS — O09213 Supervision of pregnancy with history of pre-term labor, third trimester: Secondary | ICD-10-CM | POA: Diagnosis not present

## 2015-11-02 MED ORDER — HYDROXYPROGESTERONE CAPROATE 250 MG/ML IM OIL
250.0000 mg | TOPICAL_OIL | Freq: Once | INTRAMUSCULAR | Status: AC
  Administered 2015-11-02: 250 mg via INTRAMUSCULAR

## 2015-11-02 NOTE — Progress Notes (Signed)
Patient ID: Kari Tanner, female   DOB: 08/11/1986, 30 y.o.   MRN: [id number] Pt presents for last hydroxyprogesterone injection for h/o PTL.  No changes. Pt states she had a baby shower on Sunday and grandparents were there with the flu and cousins. Son had a low grade fever and runny nose. Pt will call if she thinks she needs the Tamiflu. Pt has not had a fever, just feeling tired.

## 2015-11-02 NOTE — Telephone Encounter (Signed)
Pt came in today for injection. Had got my message.

## 2015-11-04 ENCOUNTER — Observation Stay
Admission: RE | Admit: 2015-11-04 | Discharge: 2015-11-04 | Disposition: A | Discharge status: Home / Self Care | Payer: Medicaid Other | Attending: Obstetrics and Gynecology | Admitting: Obstetrics and Gynecology

## 2015-11-04 DIAGNOSIS — O09213 Supervision of pregnancy with history of pre-term labor, third trimester: Secondary | ICD-10-CM | POA: Diagnosis not present

## 2015-11-04 DIAGNOSIS — Z3A35 35 weeks gestation of pregnancy: Secondary | ICD-10-CM | POA: Insufficient documentation

## 2015-11-04 DIAGNOSIS — O24419 Gestational diabetes mellitus in pregnancy, unspecified control: Secondary | ICD-10-CM | POA: Diagnosis present

## 2015-11-04 DIAGNOSIS — O24414 Gestational diabetes mellitus in pregnancy, insulin controlled: Principal | ICD-10-CM | POA: Insufficient documentation

## 2015-11-04 NOTE — Plan of Care (Signed)
NST reactive.  Removed from EFM.  Returns for f/up NST on Tues, 3/7.  Discharged in stable and ambulatory condition.

## 2015-11-04 NOTE — Final Progress Note (Signed)
L&D OB Triage Note  Doree AlbeeSharmanii Troublefield is a 30 y.o. [id number] female at 3429w6d, EDD Estimated Date of Delivery: 12/03/15 who presented to triage for scheduled NST for h/o GDM Class A2 on oral medication and insulin, and h/o preterm deliveries.  She denied complaints. Vital signs stable. An NST was performed and has been reviewed by MD.    NST INTERPRETATION: Indications: gestational diabetes mellitus  Mode: External Baseline Rate (A): 150 bpm Variability: Moderate Accelerations: None Decelerations: None Nonstress Test Interpretation: Reactive Overall Impression: Reassuring for gestational age Contraction Frequency (min): none  Impression: reactive   Plan: NST performed was reviewed and was found to be reactive.   Continue routine prenatal care and twice weekly NSTs. Follow up with OB/GYN as previously scheduled.     Hildred LaserAnika Charman Blasco, MD

## 2015-11-08 ENCOUNTER — Inpatient Hospital Stay
Admission: RE | Admit: 2015-11-08 | Discharge: 2015-11-08 | Disposition: A | Discharge status: Home / Self Care | Payer: Medicaid Other | Attending: Obstetrics and Gynecology | Admitting: Obstetrics and Gynecology

## 2015-11-08 ENCOUNTER — Encounter: Payer: Self-pay | Admitting: *Deleted

## 2015-11-08 ENCOUNTER — Encounter: Payer: BLUE CROSS/BLUE SHIELD | Admitting: Obstetrics and Gynecology

## 2015-11-08 ENCOUNTER — Ambulatory Visit (INDEPENDENT_AMBULATORY_CARE_PROVIDER_SITE_OTHER): Payer: Medicaid Other | Admitting: Obstetrics and Gynecology

## 2015-11-08 VITALS — BP 110/72 | HR 64 | Wt 265.3 lb

## 2015-11-08 DIAGNOSIS — Z3A34 34 weeks gestation of pregnancy: Secondary | ICD-10-CM | POA: Diagnosis not present

## 2015-11-08 DIAGNOSIS — Z113 Encounter for screening for infections with a predominantly sexual mode of transmission: Secondary | ICD-10-CM

## 2015-11-08 DIAGNOSIS — O24913 Unspecified diabetes mellitus in pregnancy, third trimester: Secondary | ICD-10-CM

## 2015-11-08 DIAGNOSIS — O09893 Supervision of other high risk pregnancies, third trimester: Secondary | ICD-10-CM

## 2015-11-08 DIAGNOSIS — E669 Obesity, unspecified: Secondary | ICD-10-CM

## 2015-11-08 DIAGNOSIS — O26893 Other specified pregnancy related conditions, third trimester: Secondary | ICD-10-CM | POA: Insufficient documentation

## 2015-11-08 DIAGNOSIS — Z369 Encounter for antenatal screening, unspecified: Secondary | ICD-10-CM

## 2015-11-08 DIAGNOSIS — Z36 Encounter for antenatal screening of mother: Secondary | ICD-10-CM

## 2015-11-08 DIAGNOSIS — O09213 Supervision of pregnancy with history of pre-term labor, third trimester: Secondary | ICD-10-CM

## 2015-11-08 DIAGNOSIS — O99213 Obesity complicating pregnancy, third trimester: Secondary | ICD-10-CM

## 2015-11-08 DIAGNOSIS — O0993 Supervision of high risk pregnancy, unspecified, third trimester: Secondary | ICD-10-CM

## 2015-11-08 LAB — POCT URINALYSIS DIPSTICK
BILIRUBIN UA: NEGATIVE
Blood, UA: NEGATIVE
Glucose, UA: NEGATIVE
KETONES UA: NEGATIVE
LEUKOCYTES UA: NEGATIVE
Nitrite, UA: NEGATIVE
Spec Grav, UA: 1.02
Urobilinogen, UA: NEGATIVE
pH, UA: 6.5

## 2015-11-08 NOTE — Progress Notes (Signed)
Pt left without signing discharge instructions .

## 2015-11-08 NOTE — Plan of Care (Signed)
Reactive NST. Dr Valentino Saxoncherry notified. Ok for pt to be discharged home. Pt to come back on Friday for NST. Then, will come in on Monday for induction/VBAC

## 2015-11-08 NOTE — Progress Notes (Signed)
ROB: Patient doing well, notes feeling increased pelvic pressure. Patient missed appointments with OB and [facility] last week due to child having flu-like symptoms.  Discussed plans for IOL at 37 weeks.  Patient still desires to VBAC despite noting that this fetus may be larger han previous 2, and risk for failure.  Discussed IOL with foley bulb and pitocin. Scheduled for 10/17/15 evening. Is due for repeat scan, has visit with [facility] on Thursday, will likely have done at that time. Continue twice weekly NSTs. Has completed 17-OHP injections.

## 2015-11-09 LAB — GC/CHLAMYDIA PROBE AMP
Chlamydia trachomatis, NAA: NEGATIVE
NEISSERIA GONORRHOEAE BY PCR: NEGATIVE

## 2015-11-10 ENCOUNTER — Inpatient Hospital Stay (HOSPITAL_BASED_OUTPATIENT_CLINIC_OR_DEPARTMENT_OTHER)
Admission: RE | Admit: 2015-11-10 | Discharge: 2015-11-10 | Disposition: A | Discharge status: Home / Self Care | Payer: Medicaid Other | Source: Ambulatory Visit

## 2015-11-10 DIAGNOSIS — O09893 Supervision of other high risk pregnancies, third trimester: Secondary | ICD-10-CM

## 2015-11-10 DIAGNOSIS — O09213 Supervision of pregnancy with history of pre-term labor, third trimester: Secondary | ICD-10-CM

## 2015-11-10 DIAGNOSIS — O24414 Gestational diabetes mellitus in pregnancy, insulin controlled: Secondary | ICD-10-CM

## 2015-11-10 DIAGNOSIS — O24913 Unspecified diabetes mellitus in pregnancy, third trimester: Secondary | ICD-10-CM

## 2015-11-10 NOTE — Progress Notes (Signed)
[facility] Consult Note: [name] did not keep her appointment with us today. It appears that she has a scheduled induction planned for later this week.  Keimani Laufer, [place] A, MD

## 2015-11-11 ENCOUNTER — Other Ambulatory Visit: Payer: Self-pay | Admitting: Obstetrics and Gynecology

## 2015-11-11 ENCOUNTER — Observation Stay
Admission: AD | Admit: 2015-11-11 | Discharge: 2015-11-11 | Disposition: A | Discharge status: Home / Self Care | Payer: Medicaid Other | Source: Ambulatory Visit | Attending: Obstetrics and Gynecology | Admitting: Obstetrics and Gynecology

## 2015-11-11 ENCOUNTER — Observation Stay: Payer: Medicaid Other

## 2015-11-11 DIAGNOSIS — O34219 Maternal care for unspecified type scar from previous cesarean delivery: Secondary | ICD-10-CM | POA: Diagnosis not present

## 2015-11-11 DIAGNOSIS — O24414 Gestational diabetes mellitus in pregnancy, insulin controlled: Principal | ICD-10-CM | POA: Insufficient documentation

## 2015-11-11 DIAGNOSIS — Z6841 Body Mass Index (BMI) 40.0 and over, adult: Secondary | ICD-10-CM | POA: Diagnosis not present

## 2015-11-11 DIAGNOSIS — E669 Obesity, unspecified: Secondary | ICD-10-CM | POA: Diagnosis not present

## 2015-11-11 DIAGNOSIS — Z3A36 36 weeks gestation of pregnancy: Secondary | ICD-10-CM | POA: Insufficient documentation

## 2015-11-11 DIAGNOSIS — O99213 Obesity complicating pregnancy, third trimester: Secondary | ICD-10-CM | POA: Insufficient documentation

## 2015-11-11 DIAGNOSIS — O24419 Gestational diabetes mellitus in pregnancy, unspecified control: Secondary | ICD-10-CM

## 2015-11-11 DIAGNOSIS — Z113 Encounter for screening for infections with a predominantly sexual mode of transmission: Secondary | ICD-10-CM

## 2015-11-11 NOTE — Progress Notes (Signed)
Pt here for NST. Plan for IOL next week, reports occasional contractions, no bleeding, LOF.

## 2015-11-11 NOTE — Final Progress Note (Signed)
L&D OB Triage Note  HPI: Kari Tanner is a 30 y.o. [id number] female at 769w6d, EDD Estimated Date of Delivery: 12/03/15 who presented to triage for scheduled NST.  She was evaluated by the nurses with findings significant for mild irregular contractions. Of note, patient has h/o GDM (uncontrolled) and priro C-section x 2, desiring to VBAC.  Patient missed last [facility] appointment scheduled for this past Thursday.  Vital signs stable. An NST was performed and has been reviewed by MD.   Exam:  Blood pressure 113/75, pulse 116, temperature 97.6 F (36.4 C), temperature source Oral, resp. rate 16, height 5\' 4"  (1.626 m), weight 265 lb (120.203 kg), last menstrual period 02/26/2015, unknown if currently breastfeeding.  NST INTERPRETATION: Indications: gestational diabetes mellitus  Mode: External Baseline Rate (A): 145 bpm Variability: Moderate Accelerations: 15 x 15 Decelerations: None     Contraction Frequency (min): 2-10  Impression: reactive   Imaging:  OB US Growth/AFI 11/11/2015:  CLINICAL DATA: Gestational diabetes.  EXAM: OBSTETRIC 14+ WK ULTRASOUND FOLLOW-UP  FINDINGS: Number of Fetuses: 1  Heart Rate: 149 bpm  Movement: Present  Presentation: Cephalic  Previa: None  Placental Location: Anterior  Amniotic Fluid (Subjective): Normal  Amniotic Fluid (Objective):  Vertical pocket 5.7 cmcm  AFI 11.8 cm  FETAL BIOMETRY  BPD: 9.1Cm 36w 5d  HC: 33.0cm 37w ford  AC: 36.9cm 48w 5d  FL: 7.2cm 36w 6d  Current Mean GA: 38w 2d US EDC: 11/23/2015  Estimated Fetal Weight: 3681g 96%ile  FETAL ANATOMY  Lateral Ventricles: Previously visualized  Thalami/CSP: Visualized  Posterior Fossa: Previously visualized  Nuchal Region: Previously visualize  Upper Lip: Previously visualized  Spine: Visualized  4 Chamber Heart on Left: Visualized  LVOT: Previously visualized  RVOT: Previously  visualized  Stomach on Left: Visualized  3 Vessel Cord: Previously visualized  Cord Insertion site: Previously visualized  Kidneys: Visualized  Bladder: Visualized  Extremities: Previously visualized  Technically difficult due to: Limited exam due to late gestational age and maternal body habitus.  IMPRESSION: Single viable intrauterine pregnancy in cephalic presentation. Mean gestational age 938 weeks 2 days. Estimated fetal weight 3681 g (96 Percentile).   Assessment:  1. GDM (insulin) uncontrolled.  2. Obesity in pregnancy 3. H/o prior C-section x 2 desiring TOLAC  Plan:  NST performed was reviewed and was found to be reactive. Patient previously scheduled for IOL (foly bulb/cytotec) at 37 weeks (Monday 3/13) for uncontrolled GDM on insulin, however based on today's scan with EGA > 96%ile, and h/o prior C-section x 2, with only SVD of 6 lb infant, would recommend repeat C-section at this time.  Will attempt to schedule C-section for same date.     Hildred LaserAnika Kymani Laursen, MD

## 2015-11-11 NOTE — Discharge Instructions (Signed)
Your procedure will be scheduled and someone will call you to let you know what time to arrive.    Report to Emergency Room at the assigned time             [facility]             [address]             [place], [place] [address]  Call this number if you have problems the morning of surgery: [phone]   Remember:            Do not eat food or drink liquids after midnight.     Do not wear jewelry, make-up or nail polish.  Do not wear lotions, powders, or perfumes. You may wear deodorant.  Do not shave prior to surgery.  Do not bring valuables to the hospital.  [facility] is not responsible for any belongings or valuables.                Contacts, dentures or bridgework may not be worn into surgery.  Leave suitcase in the car. After surgery it may be brought to your room.  For patients admitted to the hospital, discharge time is determined by your             treatment team.               Special Instructions: Preparing the skin before Cesarean Section              To help prevent the risk of infection at your surgical site, we are providing             you with rinse-free Sage 2% Chlorhexidine Gluconate (HCG) disposable             wipes.               The night before surgery:              1. Shower or bathe with warm water             2. Do not apply lotion or perfume             3. Wait one hour after shower, skin should be dry and cool             4. Open Sage wipe package - 2 disposable cloths are inside             5. Wipe the lower abdomen from the pubic line to the navel and hip bone to hip             bone with one cloth             6. Use the second cloth to wipe the front of the upper thighs             7. Allow the area to dry for one minute. DO NOT RINSE             8. Skin may feel "tacky" for several minutes             9. Dress in freshly laundered, clean clothes           10. Do not shower the morning of  surgery    Please read over the following fact sheets that you were given: Coughing and Deep Breathing  and Surgical Site Infection Prevention   Incentive Spirometer An incentive spirometer is a tool that can help keep your lungs clear and active. This tool measures how well you are filling your lungs with each breath. Taking long, deep breaths may help reverse or decrease the chance of developing breathing (pulmonary) problems (especially infection) following:  Surgery of the chest or abdomen.  Surgery if you have a history of smoking or a lung problem.  A long period of time when you are unable to move or be active. BEFORE THE PROCEDURE   If the spirometer includes an indicator to show your best effort, your nurse or respiratory therapist will set it to a desired goal.  If possible, sit up straight or lean slightly forward. Try not to slouch.  Hold the incentive spirometer in an upright position. INSTRUCTIONS FOR USE  1. Sit on the edge of your bed if possible, or sit up as far as you can in bed or on a chair. 2. Hold the incentive spirometer in an upright position. 3. Breathe out normally. 4. Place the mouthpiece in your mouth and seal your lips tightly around it. 5. Breathe in slowly and as deeply as possible, raising the piston or the ball toward the top of the column. 6. Hold your breath for 3-5 seconds or for as long as possible. Allow the piston or ball to fall to the bottom of the column. 7. Remove the mouthpiece from your mouth and breathe out normally. 8. Rest for a few seconds and repeat Steps 1 through 7 at least 10 times every 1-2 hours when you are awake. Take your time and take a few normal breaths between deep breaths. 9. The spirometer may include an indicator to show your best effort. Use the indicator as a goal to work toward during each repetition. 10. After each set of 10 deep breaths, practice coughing to be sure your lungs are clear. If you have an incision (the  cut made at the time of surgery), support your incision when coughing by placing a pillow or rolled-up towels firmly against it. Once you are able to get out of bed, walk around indoors and cough well. You may stop using the incentive spirometer when instructed by your caregiver.  RISKS AND COMPLICATIONS  Breathing too quickly may cause dizziness. At an extreme, this could cause you to pass out. Take your time so you do not get dizzy or light-headed.  If you are in pain, you may need to take or ask for pain medication before doing incentive spirometry. It is harder to take a deep breath if you are having pain. AFTER USE  Rest and breathe slowly and easily.  It can be helpful to keep a log of your progress. Your caregiver can provide you with a simple table to help with this. If you are using the spirometer at home, follow these instructions: SEEK MEDICAL CARE IF:   You are having difficultly using the spirometer.  You have trouble using the spirometer as often as instructed.  Your pain medication is not giving enough relief while using the spirometer.  You develop fever of 100.90F (38.1C) or higher. SEEK IMMEDIATE MEDICAL CARE IF:   You cough up bloody sputum that had not been present before.  You develop fever of 102F (38.9C) or greater.  You develop worsening pain at or near the incision site. MAKE SURE YOU:   Understand these instructions.  Will watch your condition.  Will get help right away  if you are not doing well or get worse. Document Released: 12/31/2006 Document Revised: 01/04/2014 Document Reviewed: 03/03/2007 [facility] Patient Information 2015 [place], [place]. This information is not intended to replace advice given to you by your health care provider. Make sure you discuss any questions you have with your health care provider.    LET [facility] Medical CenterYOUR HEALTH CARE PROVIDER KNOW ABOUT:  All medicines you are taking, including vitamins, herbs, eye drops, creams, and  over-the-counter medicines.  Previous problems you or members of your family have had with the use of anesthetics.  Any bleeding or blood clotting disorders you have.  Family history of blood clots or bleeding disorders.  Any history of deep vein thrombosis (DVT) or pulmonary embolism (PE).  Previous surgeries you have had.  Medical conditions you have.  Any allergies you have.  Complicationsinvolving the pregnancy. RISKS AND COMPLICATIONS  Generally, this is a safe procedure. However, as with any procedure, complications can occur. Possible complications include:  Bleeding.  Infection.  Blood clots.  Injury to surrounding organs.  Problems with anesthesia.  Injury to the baby. BEFORE THE PROCEDURE  11. You may be given an antacid medicine to drink. This will prevent acid contents in your stomach from going into your lungs if you vomit during the surgery. 12. You may be given an antibiotic medicine to prevent infection. PROCEDURE   To prevent infection of your incision:  Hair may be removed from your pubic area if it is near your incision.  The skin of your pubic area and lower abdomen will be cleaned with a germ-killing solution (antiseptic).  A tube (Foley catheter) will be placed in your bladder to drain your urine from your bladder into a bag. This keeps your bladder empty during surgery.  An IV tube will be placed in your vein.  You may be given medicine to numb the lower half of your body (regional anesthetic). If you were in labor, you may have already had an epidural in place which can be used in both labor and cesarean delivery. You may possibly be given medicine to make you sleep (general anesthetic) though this is not as common.  Your heart rate and your baby's heart rate will be monitored.  An incision will be made in your abdomen that extends to your uterus. There are 2 basic kinds of incisions:  The horizontal (transverse) incision. Horizontal  incisions are from side to side and are used for most routine cesarean deliveries.  The vertical incision. The vertical incision is from the top of the abdomen to the bottom and is less commonly used. It is often done for women who have a serious complication (extreme prematurity) or under emergency situations.  The horizontal and vertical incisions may both be used at the same time. However, this is very uncommon.  An incision is then made in your uterus to deliver the baby.  Your baby will be delivered.  Your health care provider may place the baby on your chest. It is important to keep the baby warm. Your health care provider will dry off the baby, place the baby directly on your bare skin, and cover the baby with warm, dry blankets.  Both incisions will be closed with absorbable stitches. AFTER THE PROCEDURE   If you were awake during the surgery, you will see your baby right away. If you were asleep, you will see your baby as soon as you are awake.  You may breastfeed your baby after surgery.  You may be  able to get up and walk the same day as the surgery. If you need to stay in bed for a period of time, you will receive help to turn, cough, and take deep breaths after surgery. This helps prevent lung problems such as pneumonia.  Do not get out of bed alone the first time after surgery. You will need help getting out of bed until you are able to do this by yourself.  You may be able to shower the day after your cesarean delivery. After the bandage (dressing) is taken off the incision site, a nurse will assist you to shower if you would like help.  You may be directed to take actions to help prevent blood clots in your legs. These may include:  Walking shortly after surgery, with someone assisting you. Moving around after surgery helps to improve blood flow.  Wearing compression stockings or using different types of devices.  Taking medicines to thin your blood (anticoagulants)  if you are at high risk for DVT or PE.  Save any blood clots that you pass from your vagina. If you pass a clot while on the toilet, do not flush it. Call for the nurse. Tell the nurse if you think you are bleeding too much or passing too many clots.  You will be given medicine for pain and nausea as needed. Let your health care providers know if you are hurting. You may also be given an antibiotic to prevent an infection.  Your IV tube will be taken out when you are drinking a reasonable amount of fluids. The Foley catheter is taken out when you are up and walking.  If your blood type is Rh negative and your baby's blood type is Rh positive, you will be given a shot of anti-D immune globulin. This shot prevents you from having Rh problems with a future pregnancy. You should get the shot even if you had your tubes tied (tubal ligation).  If you are allowed to take the baby for a walk, place the baby in the bassinet and push it.   This information is not intended to replace advice given to you by your health care provider. Make sure you discuss any questions you have with your health care provider.   Document Released: 08/20/2005 Document Revised: 05/11/2015 Document Reviewed: 04/16/2012 Elsevier Interactive Patient Education [employer]

## 2015-11-12 LAB — CULTURE, BETA STREP (GROUP B ONLY): STREP GP B CULTURE: POSITIVE — AB

## 2015-11-14 ENCOUNTER — Encounter
Admission: RE | Admit: 2015-11-14 | Discharge: 2015-11-14 | Disposition: A | Discharge status: Home / Self Care | Payer: Medicaid Other | Source: Ambulatory Visit | Attending: Obstetrics and Gynecology | Admitting: Obstetrics and Gynecology

## 2015-11-14 ENCOUNTER — Inpatient Hospital Stay
Admission: RE | Admit: 2015-11-14 | Discharge: 2015-11-17 | DRG: 765 | Disposition: A | Discharge status: Home / Self Care | Payer: Medicaid Other | Source: Ambulatory Visit | Attending: Obstetrics and Gynecology | Admitting: Obstetrics and Gynecology

## 2015-11-14 ENCOUNTER — Encounter
Admission: RE | Disposition: A | Discharge status: Home / Self Care | Payer: Self-pay | Source: Ambulatory Visit | Attending: Obstetrics and Gynecology

## 2015-11-14 ENCOUNTER — Inpatient Hospital Stay: Payer: Medicaid Other | Admitting: Anesthesiology

## 2015-11-14 ENCOUNTER — Encounter: Payer: Self-pay | Admitting: Anesthesiology

## 2015-11-14 DIAGNOSIS — O3663X0 Maternal care for excessive fetal growth, third trimester, not applicable or unspecified: Secondary | ICD-10-CM | POA: Diagnosis present

## 2015-11-14 DIAGNOSIS — Z9889 Other specified postprocedural states: Secondary | ICD-10-CM | POA: Diagnosis not present

## 2015-11-14 DIAGNOSIS — N856 Intrauterine synechiae: Secondary | ICD-10-CM | POA: Diagnosis present

## 2015-11-14 DIAGNOSIS — Z8249 Family history of ischemic heart disease and other diseases of the circulatory system: Secondary | ICD-10-CM | POA: Diagnosis not present

## 2015-11-14 DIAGNOSIS — O99214 Obesity complicating childbirth: Secondary | ICD-10-CM | POA: Diagnosis present

## 2015-11-14 DIAGNOSIS — Z3A38 38 weeks gestation of pregnancy: Secondary | ICD-10-CM | POA: Diagnosis not present

## 2015-11-14 DIAGNOSIS — D62 Acute posthemorrhagic anemia: Secondary | ICD-10-CM | POA: Diagnosis present

## 2015-11-14 DIAGNOSIS — Z3A37 37 weeks gestation of pregnancy: Secondary | ICD-10-CM

## 2015-11-14 DIAGNOSIS — O24425 Gestational diabetes mellitus in childbirth, controlled by oral hypoglycemic drugs: Secondary | ICD-10-CM | POA: Diagnosis present

## 2015-11-14 DIAGNOSIS — Z7984 Long term (current) use of oral hypoglycemic drugs: Secondary | ICD-10-CM | POA: Diagnosis not present

## 2015-11-14 DIAGNOSIS — Z3493 Encounter for supervision of normal pregnancy, unspecified, third trimester: Secondary | ICD-10-CM

## 2015-11-14 DIAGNOSIS — K660 Peritoneal adhesions (postprocedural) (postinfection): Secondary | ICD-10-CM | POA: Diagnosis present

## 2015-11-14 DIAGNOSIS — Z87891 Personal history of nicotine dependence: Secondary | ICD-10-CM

## 2015-11-14 DIAGNOSIS — Z833 Family history of diabetes mellitus: Secondary | ICD-10-CM | POA: Diagnosis not present

## 2015-11-14 DIAGNOSIS — O34219 Maternal care for unspecified type scar from previous cesarean delivery: Secondary | ICD-10-CM | POA: Diagnosis present

## 2015-11-14 DIAGNOSIS — Z794 Long term (current) use of insulin: Secondary | ICD-10-CM

## 2015-11-14 DIAGNOSIS — Z7982 Long term (current) use of aspirin: Secondary | ICD-10-CM | POA: Diagnosis not present

## 2015-11-14 DIAGNOSIS — O99824 Streptococcus B carrier state complicating childbirth: Secondary | ICD-10-CM | POA: Diagnosis present

## 2015-11-14 DIAGNOSIS — N736 Female pelvic peritoneal adhesions (postinfective): Secondary | ICD-10-CM | POA: Diagnosis present

## 2015-11-14 DIAGNOSIS — O34211 Maternal care for low transverse scar from previous cesarean delivery: Secondary | ICD-10-CM | POA: Diagnosis present

## 2015-11-14 DIAGNOSIS — Z79899 Other long term (current) drug therapy: Secondary | ICD-10-CM | POA: Diagnosis not present

## 2015-11-14 DIAGNOSIS — Z98891 History of uterine scar from previous surgery: Secondary | ICD-10-CM

## 2015-11-14 LAB — CBC
HEMATOCRIT: 39 % (ref 35.0–47.0)
HEMOGLOBIN: 13.4 g/dL (ref 12.0–16.0)
MCH: 30.7 pg (ref 26.0–34.0)
MCHC: 34.2 g/dL (ref 32.0–36.0)
MCV: 89.8 fL (ref 80.0–100.0)
Platelets: 166 10*3/uL (ref 150–440)
RBC: 4.35 MIL/uL (ref 3.80–5.20)
RDW: 13.8 % (ref 11.5–14.5)
WBC: 5.4 10*3/uL (ref 3.6–11.0)

## 2015-11-14 LAB — TYPE AND SCREEN
ABO/RH(D): O POS
ANTIBODY SCREEN: NEGATIVE

## 2015-11-14 LAB — ABO/RH: ABO/RH(D): O POS

## 2015-11-14 LAB — GLUCOSE, CAPILLARY: GLUCOSE-CAPILLARY: 101 mg/dL — AB (ref 65–99)

## 2015-11-14 SURGERY — Surgical Case
Anesthesia: Spinal

## 2015-11-14 MED ORDER — BUPIVACAINE IN DEXTROSE 0.75-8.25 % IT SOLN
INTRATHECAL | Status: DC | PRN
  Administered 2015-11-14: 1.7 mL via INTRATHECAL

## 2015-11-14 MED ORDER — SODIUM CHLORIDE 0.9 % IV SOLN
10000.0000 ug | INTRAVENOUS | Status: DC | PRN
  Administered 2015-11-14: 20 ug/min via INTRAVENOUS

## 2015-11-14 MED ORDER — DIPHENHYDRAMINE HCL 25 MG PO CAPS: 25.0000 mg | ORAL_CAPSULE | Freq: Four times a day (QID) | ORAL | Status: DC | PRN

## 2015-11-14 MED ORDER — FENTANYL CITRATE (PF) 100 MCG/2ML IJ SOLN: 25.0000 ug | INTRAMUSCULAR | Status: DC | PRN

## 2015-11-14 MED ORDER — LACTATED RINGERS IV SOLN
INTRAVENOUS | Status: DC | PRN
  Administered 2015-11-14: 12:00:00 via INTRAVENOUS

## 2015-11-14 MED ORDER — PROMETHAZINE HCL 25 MG PO TABS
ORAL_TABLET | ORAL | Status: AC
  Filled 2015-11-14: qty 1

## 2015-11-14 MED ORDER — ONDANSETRON HCL 4 MG/2ML IJ SOLN
4.0000 mg | Freq: Once | INTRAMUSCULAR | Status: AC | PRN
  Administered 2015-11-14: 4 mg via INTRAVENOUS
  Filled 2015-11-14: qty 2

## 2015-11-14 MED ORDER — LANOLIN HYDROUS EX OINT: 1.0000 "application " | TOPICAL_OINTMENT | CUTANEOUS | Status: DC | PRN

## 2015-11-14 MED ORDER — NALOXONE HCL 0.4 MG/ML IJ SOLN: 0.4000 mg | INTRAMUSCULAR | Status: DC | PRN

## 2015-11-14 MED ORDER — OXYCODONE-ACETAMINOPHEN 5-325 MG PO TABS
2.0000 | ORAL_TABLET | ORAL | Status: DC | PRN
Start: 2015-11-14 — End: 2015-11-17

## 2015-11-14 MED ORDER — ONDANSETRON HCL 4 MG/2ML IJ SOLN: 4.0000 mg | Freq: Three times a day (TID) | INTRAMUSCULAR | Status: DC | PRN

## 2015-11-14 MED ORDER — PROMETHAZINE HCL 25 MG PO TABS: 25.0000 mg | ORAL_TABLET | Freq: Four times a day (QID) | ORAL | Status: DC | PRN

## 2015-11-14 MED ORDER — ACETAMINOPHEN 325 MG PO TABS
650.0000 mg | ORAL_TABLET | ORAL | Status: DC | PRN
  Administered 2015-11-16 (×2): 650 mg via ORAL
  Filled 2015-11-14 (×2): qty 2

## 2015-11-14 MED ORDER — LIDOCAINE 5 % EX PTCH
1.0000 | MEDICATED_PATCH | CUTANEOUS | Status: AC
  Administered 2015-11-15 – 2015-11-16 (×2): 1 via TRANSDERMAL
  Filled 2015-11-14 (×2): qty 1

## 2015-11-14 MED ORDER — NALBUPHINE HCL 10 MG/ML IJ SOLN: 5.0000 mg | Freq: Once | INTRAMUSCULAR | Status: DC | PRN

## 2015-11-14 MED ORDER — LIDOCAINE 5 % EX PTCH
MEDICATED_PATCH | CUTANEOUS | Status: AC
  Filled 2015-11-14: qty 1

## 2015-11-14 MED ORDER — SENNOSIDES-DOCUSATE SODIUM 8.6-50 MG PO TABS
2.0000 | ORAL_TABLET | ORAL | Status: DC
  Administered 2015-11-14 – 2015-11-17 (×3): 2 via ORAL
  Filled 2015-11-14 (×3): qty 2

## 2015-11-14 MED ORDER — NALOXONE HCL 2 MG/2ML IJ SOSY: 1.0000 ug/kg/h | PREFILLED_SYRINGE | INTRAMUSCULAR | Status: DC | PRN

## 2015-11-14 MED ORDER — OXYTOCIN 40 UNITS IN LACTATED RINGERS INFUSION - SIMPLE MED
INTRAVENOUS | Status: AC
  Administered 2015-11-14: 500 mL via INTRAVENOUS
  Filled 2015-11-14: qty 1000

## 2015-11-14 MED ORDER — SODIUM CHLORIDE 0.9% FLUSH: 3.0000 mL | INTRAVENOUS | Status: DC | PRN

## 2015-11-14 MED ORDER — DIPHENHYDRAMINE HCL 50 MG/ML IJ SOLN: 12.5000 mg | INTRAMUSCULAR | Status: DC | PRN

## 2015-11-14 MED ORDER — PHENYLEPHRINE HCL 10 MG/ML IJ SOLN
INTRAMUSCULAR | Status: DC | PRN
  Administered 2015-11-14: 50 ug via INTRAVENOUS

## 2015-11-14 MED ORDER — KETOROLAC TROMETHAMINE 30 MG/ML IJ SOLN
30.0000 mg | Freq: Four times a day (QID) | INTRAMUSCULAR | Status: AC | PRN
  Administered 2015-11-14: 30 mg via INTRAVENOUS
  Filled 2015-11-14: qty 1

## 2015-11-14 MED ORDER — KETOROLAC TROMETHAMINE 30 MG/ML IJ SOLN: 30.0000 mg | Freq: Four times a day (QID) | INTRAMUSCULAR | Status: AC | PRN

## 2015-11-14 MED ORDER — ONDANSETRON HCL 4 MG/2ML IJ SOLN
INTRAMUSCULAR | Status: DC | PRN
  Administered 2015-11-14: 4 mg via INTRAVENOUS

## 2015-11-14 MED ORDER — OXYTOCIN 10 UNIT/ML IJ SOLN
2.5000 [IU]/h | INTRAVENOUS | Status: AC
  Filled 2015-11-14: qty 10

## 2015-11-14 MED ORDER — PRENATAL MULTIVITAMIN CH
1.0000 | ORAL_TABLET | Freq: Every day | ORAL | Status: DC
  Administered 2015-11-15 – 2015-11-17 (×3): 1 via ORAL
  Filled 2015-11-14 (×3): qty 1

## 2015-11-14 MED ORDER — LACTATED RINGERS IV SOLN
INTRAVENOUS | Status: DC
  Administered 2015-11-14 (×2): via INTRAVENOUS

## 2015-11-14 MED ORDER — DEXTROSE 5 % IV SOLN
3.0000 g | INTRAVENOUS | Status: AC
  Administered 2015-11-14: 3 g via INTRAVENOUS
  Filled 2015-11-14: qty 3000

## 2015-11-14 MED ORDER — FERROUS SULFATE 325 (65 FE) MG PO TABS
325.0000 mg | ORAL_TABLET | Freq: Two times a day (BID) | ORAL | Status: DC
  Administered 2015-11-15 – 2015-11-17 (×5): 325 mg via ORAL
  Filled 2015-11-14 (×5): qty 1

## 2015-11-14 MED ORDER — DIBUCAINE 1 % RE OINT: 1.0000 "application " | TOPICAL_OINTMENT | RECTAL | Status: DC | PRN

## 2015-11-14 MED ORDER — SIMETHICONE 80 MG PO CHEW: 80.0000 mg | CHEWABLE_TABLET | ORAL | Status: DC | PRN

## 2015-11-14 MED ORDER — MORPHINE SULFATE (PF) 0.5 MG/ML IJ SOLN
INTRAMUSCULAR | Status: DC | PRN
  Administered 2015-11-14: .1 mg via EPIDURAL

## 2015-11-14 MED ORDER — NALBUPHINE HCL 10 MG/ML IJ SOLN: 5.0000 mg | INTRAMUSCULAR | Status: DC | PRN

## 2015-11-14 MED ORDER — OXYCODONE-ACETAMINOPHEN 5-325 MG PO TABS
1.0000 | ORAL_TABLET | ORAL | Status: DC | PRN
Start: 2015-11-14 — End: 2015-11-17

## 2015-11-14 MED ORDER — MENTHOL 3 MG MT LOZG: 1.0000 | LOZENGE | OROMUCOSAL | Status: DC | PRN

## 2015-11-14 MED ORDER — WITCH HAZEL-GLYCERIN EX PADS: 1.0000 "application " | MEDICATED_PAD | CUTANEOUS | Status: DC | PRN

## 2015-11-14 MED ORDER — SIMETHICONE 80 MG PO CHEW
80.0000 mg | CHEWABLE_TABLET | ORAL | Status: DC
  Administered 2015-11-14 – 2015-11-17 (×3): 80 mg via ORAL
  Filled 2015-11-14 (×3): qty 1

## 2015-11-14 MED ORDER — DIPHENHYDRAMINE HCL 25 MG PO CAPS: 25.0000 mg | ORAL_CAPSULE | ORAL | Status: DC | PRN

## 2015-11-14 MED ORDER — CITRIC ACID-SODIUM CITRATE 334-500 MG/5ML PO SOLN
ORAL | Status: AC
  Administered 2015-11-14: 30 mL
  Filled 2015-11-14: qty 15

## 2015-11-14 MED ORDER — LIDOCAINE 5 % EX PTCH
1.0000 | MEDICATED_PATCH | CUTANEOUS | Status: DC
Start: 2015-11-14 — End: 2015-11-14

## 2015-11-14 MED ORDER — ZOLPIDEM TARTRATE 5 MG PO TABS: 5.0000 mg | ORAL_TABLET | Freq: Every evening | ORAL | Status: DC | PRN

## 2015-11-14 MED ORDER — LACTATED RINGERS IV SOLN
INTRAVENOUS | Status: DC
  Administered 2015-11-14 – 2015-11-15 (×4): via INTRAVENOUS

## 2015-11-14 MED ORDER — FENTANYL CITRATE (PF) 100 MCG/2ML IJ SOLN
INTRAMUSCULAR | Status: DC | PRN
  Administered 2015-11-14: 15 ug via INTRAVENOUS

## 2015-11-14 MED ORDER — MAGNESIUM HYDROXIDE 400 MG/5ML PO SUSP: 30.0000 mL | ORAL | Status: DC | PRN

## 2015-11-14 MED ORDER — IBUPROFEN 600 MG PO TABS
600.0000 mg | ORAL_TABLET | Freq: Four times a day (QID) | ORAL | Status: DC
  Administered 2015-11-14 – 2015-11-17 (×11): 600 mg via ORAL
  Filled 2015-11-14 (×11): qty 1

## 2015-11-14 MED ORDER — ACETAMINOPHEN 500 MG PO TABS
1000.0000 mg | ORAL_TABLET | Freq: Four times a day (QID) | ORAL | Status: AC
  Administered 2015-11-14 – 2015-11-15 (×3): 1000 mg via ORAL
  Filled 2015-11-14 (×3): qty 2

## 2015-11-14 SURGICAL SUPPLY — 31 items
BAG COUNTER SPONGE EZ (MISCELLANEOUS) ×2 IMPLANT
CANISTER SUCT 3000ML (MISCELLANEOUS) ×3 IMPLANT
CHLORAPREP W/TINT 26ML (MISCELLANEOUS) ×6 IMPLANT
COUNTER SPONGE BAG EZ (MISCELLANEOUS) ×1
DRSG TELFA 3X8 NADH (GAUZE/BANDAGES/DRESSINGS) ×3 IMPLANT
ELECT REM PT RETURN 9FT ADLT (ELECTROSURGICAL) ×3
ELECTRODE REM PT RTRN 9FT ADLT (ELECTROSURGICAL) ×1 IMPLANT
GAUZE SPONGE 4X4 12PLY STRL (GAUZE/BANDAGES/DRESSINGS) ×3 IMPLANT
GLOVE BIO SURGEON STRL SZ 6 (GLOVE) ×3 IMPLANT
GLOVE BIOGEL PI IND STRL 6.5 (GLOVE) ×1 IMPLANT
GLOVE BIOGEL PI INDICATOR 6.5 (GLOVE) ×2
GOWN STRL REUS W/ TWL LRG LVL3 (GOWN DISPOSABLE) ×2 IMPLANT
GOWN STRL REUS W/TWL LRG LVL3 (GOWN DISPOSABLE) ×4
HEMOSTAT ARISTA ABSORB 3G PWDR (MISCELLANEOUS) ×3 IMPLANT
KIT RM TURNOVER STRD PROC AR (KITS) ×3 IMPLANT
MARKER SKIN DUAL TIP RULER LAB (MISCELLANEOUS) ×3 IMPLANT
NS IRRIG 1000ML POUR BTL (IV SOLUTION) ×3 IMPLANT
PACK C SECTION AR (MISCELLANEOUS) ×3 IMPLANT
PAD OB MATERNITY 4.3X12.25 (PERSONAL CARE ITEMS) ×3 IMPLANT
PAD PREP 24X41 OB/GYN DISP (PERSONAL CARE ITEMS) ×3 IMPLANT
RETRACTOR TRAXI PANNICULUS (MISCELLANEOUS) ×1 IMPLANT
SPONGE LAP 18X18 5 PK (GAUZE/BANDAGES/DRESSINGS) ×6 IMPLANT
SUT CHROMIC 0 CT 1 (SUTURE) IMPLANT
SUT MNCRL AB 4-0 PS2 18 (SUTURE) ×3 IMPLANT
SUT PLAIN 2 0 XLH (SUTURE) IMPLANT
SUT VIC AB 0 CT1 36 (SUTURE) ×6 IMPLANT
SUT VIC AB 1 CT1 36 (SUTURE) ×6 IMPLANT
SUT VIC AB 2-0 CT1 36 (SUTURE) ×3 IMPLANT
SUT VIC AB 3-0 SH 27 (SUTURE) ×2
SUT VIC AB 3-0 SH 27X BRD (SUTURE) ×1 IMPLANT
TRAXI PANNICULUS RETRACTOR (MISCELLANEOUS) ×2

## 2015-11-14 NOTE — Lactation Note (Signed)
This note was copied from a baby's chart. Lactation Consultation Note  Patient Name: Girl Doree AlbeeSharmanii Yaun EPPIR'JToday's Date: 11/14/2015 Reason for consult: Initial assessment   Maternal Data Does the patient have breastfeeding experience prior to this delivery?: Yes  Feeding Feeding Type: Bottle Fed - Formula Nipple Type: Slow - flow Length of feed: 30 min First feeding at breast, baby very sleepy, not opening mouth well, mom has large , soft breasts, able to latch baby to left and right breast, difficult to get deep latch, baby nursed 25 min total, needs stimulation to suck , occ swallow heard. LATCH Score/Interventions Latch: Repeated attempts needed to sustain latch, nipple held in mouth throughout feeding, stimulation needed to elicit sucking reflex. Intervention(s): Adjust position;Assist with latch;Breast compression  Audible Swallowing: A few with stimulation Intervention(s): Skin to skin  Type of Nipple: Everted at rest and after stimulation  Comfort (Breast/Nipple): Soft / non-tender     Hold (Positioning): Assistance needed to correctly position infant at breast and maintain latch. Intervention(s): Support Pillows;Position options;Skin to skin  LATCH Score: 7  Lactation Tools Discussed/Used WIC Program: No   Consult Status Consult Status: PRN    Dyann KiefMarsha D Nygeria Lager 11/14/2015, 7:24 PM

## 2015-11-14 NOTE — Op Note (Signed)
Cesarean Section Procedure Note  Indications: previous uterine incision low transverse x 2, suspected LGA infant  Pre-operative Diagnosis: 37 week 2 day pregnancy, prior C-section x 2, suspected LGA infant, gestational diabetes (uncontrolled on insulin and metformin).  Post-operative Diagnosis: Same, with LGA infant Surgeon: Hildred Laser, MD  Assistants: Sharon Seller, MD  Procedure: Repeat low transverse Cesarean Section, lysis of adhesions  Anesthesia: Spinal anesthesia  ASA Class: II  Procedure Details: The patient was seen in the Holding Room. The risks, benefits, complications, treatment options, and expected outcomes were discussed with the patient.  The patient concurred with the proposed plan, giving informed consent.  The site of surgery properly noted/marked. The patient was taken to the Operating Room, identified as [facility] and the procedure verified as C-Section Delivery. A Time Out was held and the above information confirmed.  After induction of anesthesia, the patient was draped and prepped in the usual sterile manner. Anesthesia was tested and noted to be adequate. A Pfannenstiel incision was made and carried down through the subcutaneous tissue to the fascia. Fascial incision was made and extended transversely. There was scarring noted at the level of the fascia. The fascia was separated from the underlying rectus tissue superiorly and inferiorly. The peritoneum was identified and entered.  Peritoneal incision was extended longitudinally.  Adhesions of the uterus to the anterior abdominal wall were noted.  These adhesions were taken down.  The bladder was noted to be densely adherent to the lower uterine segment.  A bladder flap could not be created due to the adhesions.  Staying above the vesico-uterine reflection, a low transverse uterine incision was made. Delivered from cephalic presentation was a 4360 gram Female with Apgar scores of 8 at one minute and 9 at  five minutes.  A large amount of clear fluid was noted at rupture of amniotic sac. After the umbilical cord was clamped and cut, cord blood was obtained for evaluation. The placenta was removed intact and appeared normal. The uterus was exteriorized and cleared of all clots and debris. The uterine outline, tubes and ovaries appeared normal.  There were numerous filmy adhesions of the left tube an ovary. These adhesions were lysed using the bovie. The uterine incision was closed with running locked sutures of 0-Vicryl.  Adhesions of the omentum were noted at the left lateral edge of the uterine incision and left adnexa.  These adhesions were taken down using the bovie.   Hemostasis at the incision site was observed. The abdominal adhesions previously lysed were noted to have oozing.  Two deep figure-of-eight sutures using 3-0 Vicryl were placed for hemostasis.  Hemostasis was achieved. The uterus was returned to the abdomen.  Lavage was carried out until clear.   Arista was placed over the raw serosal surface and incision site for added hemostasis.   The fascia was then reapproximated with a running suture of 0 Vicryl. The skin was reapproximated with staples.   Instrument, sponge, and needle counts were correct prior the abdominal closure and at the conclusion of the case.   Findings: Female infant, cephalic presentation, 4360 grams, with Apgar scores of 8 at one minute and 9 at five minutes. Intact placenta with 3 vessel cord.  The uterine outline, tubes and ovaries appeared normal.  Adhesions of uterus to anterior abdominal wall.  Adhesions of right adnexae (tube and ovary).  Adhesions of bladder to lower uterine segment.  Adhesions of omentum to left lower uterine segment and adnexa. Clinical polyhydramnios at time of delivery,  with large amount of clear fluid.   Estimated Blood Loss:  700 ml      Drains: foley catheter to gravity drainage, 150 clear urine at end of the procedure         Total IV  Fluids:  900 ml  Specimens: Placenta and Disposition:  Sent to Pathology         Implants: None         Complications:  None; patient tolerated the procedure well.         Disposition: PACU - hemodynamically stable.         Condition: stable   Hildred LaserAnika Celesta Funderburk, MD [facility]

## 2015-11-14 NOTE — Anesthesia Procedure Notes (Signed)
Spinal Patient location during procedure: OR Start time: 11/14/2015 11:40 AM Staffing Resident/CRNA: Doreen Salvage Preanesthetic Checklist Completed: patient identified, site marked, surgical consent, pre-op evaluation, timeout performed, IV checked, risks and benefits discussed and monitors and equipment checked Spinal Block Patient position: sitting Prep: Betadine Patient monitoring: heart rate, continuous pulse ox, blood pressure and cardiac monitor Approach: midline Location: L4-5 Injection technique: single-shot Needle Needle type: Whitacre and Introducer  Needle gauge: 24 G Needle length: 9 cm Additional Notes Negative paresthesia. Negative blood return. Positive free-flowing CSF. Expiration date of kit checked and confirmed. Patient tolerated procedure well, without complications.

## 2015-11-14 NOTE — Anesthesia Preprocedure Evaluation (Addendum)
Anesthesia Evaluation  Patient identified by MRN, date of birth, ID band Patient awake    Reviewed: Allergy & Precautions, H&P , NPO status , Patient's Chart, lab work & pertinent test results  History of Anesthesia Complications Negative for: history of anesthetic complications  Airway Mallampati: III  TM Distance: >3 FB Neck ROM: full    Dental  (+) Poor Dentition, Chipped   Pulmonary neg shortness of breath, former smoker,    Pulmonary exam normal breath sounds clear to auscultation       Cardiovascular Exercise Tolerance: Good (-) hypertension(-) angina(-) Past MI and (-) DOE negative cardio ROS Normal cardiovascular exam Rhythm:regular Rate:Normal     Neuro/Psych negative neurological ROS  negative psych ROS   GI/Hepatic negative GI ROS, Neg liver ROS,   Endo/Other  diabetes, Type 2, Insulin DependentMorbid obesity  Renal/GU negative Renal ROS  negative genitourinary   Musculoskeletal   Abdominal   Peds  Hematology negative hematology ROS (+)   Anesthesia Other Findings Past Medical History:   History of miscarriage, currently pregnant                   Ovarian cyst                                                 Diabetes mellitus without complication (HCC)                 Obesity affecting pregnancy                     2016        Past Surgical History:   c sections                                                    OVARIAN CYST SURGERY                            Right 2007         CESAREAN SECTION                                                Reproductive/Obstetrics (+) Pregnancy                            Anesthesia Physical Anesthesia Plan  ASA: III  Anesthesia Plan: Spinal   Post-op Pain Management:    Induction:   Airway Management Planned:   Additional Equipment:   Intra-op Plan:   Post-operative Plan:   Informed Consent: I have reviewed the patients  History and Physical, chart, labs and discussed the procedure including the risks, benefits and alternatives for the proposed anesthesia with the patient or authorized representative who has indicated his/her understanding and acceptance.   Dental Advisory Given  Plan Discussed with: Anesthesiologist, CRNA and Surgeon  Anesthesia Plan Comments:         Anesthesia Quick Evaluation

## 2015-11-14 NOTE — Transfer of Care (Signed)
Immediate Anesthesia Transfer of Care Note  Patient: Kari Tanner  Procedure(s) Performed: Procedure(s): REPEAT CESAREAN SECTION (N/A)  Patient Location: PACU  Anesthesia Type:Spinal  Level of Consciousness: awake, alert  and oriented  Airway & Oxygen Therapy: Patient Spontanous Breathing and Patient connected to face mask oxygen  Post-op Assessment: Report given to RN and Post -op Vital signs reviewed and stable  Post vital signs: Reviewed and stable  Last Vitals:  Filed Vitals:   11/14/15 1311  BP: 105/67  Pulse: 81  Temp: 36.4 C  Resp: 14    Complications: No apparent anesthesia complications

## 2015-11-14 NOTE — H&P (Signed)
Obstetric Preoperative History and Physical  Kari Tanner is a 30 y.o. [id number] with IUP at 38w2dpresenting presenting for scheduled repeat cesarean section.  No acute concerns.    Prenatal Course Source of Care: [facility] with onset of care at 11 weeks Pregnancy complications or risks: Patient Active Problem List   Diagnosis Date Noted  . Labor and delivery, indication for care 10/11/2015  . Polyhydramnios 09/28/2015  . Supervision of high risk pregnancy in third trimester 08/16/2015  . Diabetes mellitus during pregnancy, antepartum 07/04/2015  . History of preterm delivery, currently pregnant in third trimester 07/04/2015  . Obesity affecting pregnancy, antepartum 07/04/2015   She plans to breastfeed She desires oral contraceptives (estrogen/progesterone) for postpartum contraception.   Prenatal labs and studies: ABO, Rh: O/Positive/-- (08/19 1618) Antibody: Negative (08/19 1618) Rubella: 1.77 (08/19 1618) RPR: Non Reactive (08/19 1618)  HBsAg: Negative (08/19 1618)  HIV: Non Reactive (08/19 1618)  GBS:  Positive (03/07 1640) 1 hr Glucola  Abnormal (374) Genetic screening normal Anatomy UK[place]ormal  Prenatal Transfer Tool  Maternal Diabetes: Yes:  Diabetes Type:  Insulin/Medication controlled Genetic Screening: Normal Maternal Ultrasounds/Referrals: Normal Fetal Ultrasounds or other Referrals:  Fetal echo, Referred to Materal Fetal Medicine  Maternal Substance Abuse:  No Significant Maternal Medications:  Meds include: Progesterone Other: Insulin and Metformin Significant Maternal Lab Results: Lab values include: Group B Strep positive    OB History  Gravida Para Term Preterm AB SAB TAB Ectopic Multiple Living  '6 3 1 2 2 2    3    ' # Outcome Date GA Lbr Len/2nd Weight Sex Delivery Anes PTL Lv  6 Current           5 SAB 2012          4 SAB 2012          3 Preterm 03/17/08 367w0d7 lb 2 oz (3.232 kg) F CS-Unspec  Y Y  2 Preterm 08/04/06 3481w0d lb  2 oz (2.778 kg) M CS-Unspec  Y Y     Complications: Fetal Intolerance,Gestational diabetes mellitus in childbirth, insulin controlled  1 Term 07/28/00 39w26w0dlb 4 oz (3.289 kg) F Vag-Spont  N Y     Past Medical History  Diagnosis Date  . History of miscarriage, currently pregnant   . Ovarian cyst   . Diabetes mellitus without complication (HCC)[place]. Obesity affecting pregnancy 2016    Past Surgical History  Procedure Laterality Date  . C sections    . Ovarian cyst surgery Right 2007  . Cesarean section     Family History  Problem Relation Age of Onset  . Diabetes Mother   . Hypertension Mother   . Hypertension Paternal Grandmother   . Diabetes Maternal Aunt   . Diabetes Maternal Grandfather      Social History   Social History  . Marital Status: Married    Spouse Name: N/A  . Number of Children: N/A  . Years of Education: N/A   Social History Main Topics  . Smoking status: Former [employer] scientist (life sciences)Smokeless tobacco: Never Used  . Alcohol Use: No  . Drug Use: No  . Sexual Activity: Yes    Birth Control/ Protection: None   Other Topics Concern  . Not on file   Social History Narrative    No current facility-administered medications on file prior to encounter.   Current Outpatient Prescriptions on File Prior to Encounter  Medication Sig Dispense Refill  .  aspirin 81 MG tablet Take 81 mg by mouth daily.    . B-D INS SYR ULTRAFINE 1CC/30G 30G X 1/2" 1 ML MISC 2 (two) times daily. Reported on 10/03/2015  0  . B-D UF III MINI PEN NEEDLES 31G X 5 MM MISC use with LANTUS AND HUMALOG  1  . Blood Glucose Monitoring Suppl (ACCU-CHEK AVIVA CONNECT) W/DEVICE KIT     . Blood Glucose Monitoring Suppl (ACCU-CHEK AVIVA) device Use as instructed 1 each 0  . folic acid (FOLVITE) 1 MG tablet Take 1 tablet (1 mg total) by mouth daily. 30 tablet 10  . glucose blood (ACCU-CHEK ACTIVE STRIPS) test strip Check blood sugars 4xd as instructed. 100 each 12  . insulin glargine (LANTUS) 100  UNIT/ML injection Inject 0.4 mLs (40 Units total) into the skin at bedtime. (Patient taking differently: Inject 44 Units into the skin at bedtime. ) 10 mL 11  . insulin lispro (HUMALOG) 100 UNIT/ML injection Inject 0.1 mLs (10 Units total) into the skin 3 (three) times daily before meals. (Patient taking differently: Inject 12 Units into the skin 3 (three) times daily before meals. ) 10 mL 11  . INSULIN SYRINGE 1CC/29G 29G X 1/2" 1 ML MISC Reported on 09/26/2015    . Insulin Syringes, Disposable, U-100 1 ML MISC 2 Syringes by Does not apply route 2 (two) times daily. 200 each 6  . MAKENA 250 MG/ML OIL injection INJECT 1ML INTRAMUSCULARLY ONCE A WEEK  4  . metFORMIN (GLUCOPHAGE) 500 MG tablet Take 1 tablet (500 mg total) by mouth 2 (two) times daily with a meal. (Patient taking differently: Take 1,000 mg by mouth 2 (two) times daily with a meal. ) 60 tablet 3  . nitrofurantoin, macrocrystal-monohydrate, (MACROBID) 100 MG capsule Take 1 capsule (100 mg total) by mouth 2 (two) times daily. 14 capsule 0  . Prenatal Vit-Fe Fumarate-FA (PRENATAL VITAMIN) 27-0.8 MG TABS Take by mouth.       No Known Allergies  Review of Systems: Negative except for what is mentioned in HPI.  Physical Exam: LMP 02/26/2015 FHR by Doppler: 140 bpm GENERAL: Well-developed, well-nourished female in no acute distress.  LUNGS: Clear to auscultation bilaterally.  HEART: Regular rate and rhythm. ABDOMEN: Soft, nontender, nondistended, gravid, Well-healed Pfannenstiel incision. PELVIC: Deferred EXTREMITIES: Nontender, no edema, 2+ distal pulses.   Pertinent Labs/Studies:   Results for orders placed or performed during the hospital encounter of 11/14/15 (from the past 72 hour(s))  Glucose, capillary     Status: Abnormal   Collection Time: 11/14/15  9:49 AM  Result Value Ref Range   Glucose-Capillary 101 (H) 65 - 99 mg/dL    Assessment and Plan :Kari Tanner is a 30 y.o. [id number] at 76w2dbeing admitted  for  scheduled cesarean section delivery . The patient is understanding of the planned procedure and is aware of and accepting of all surgical risks, including but not limited to: bleeding which may require transfusion or reoperation; infection which may require antibiotics; injury to bowel, bladder, ureters or other surrounding organs which may require repair; injury to the fetus; need for additional procedures including hysterectomy in the event of life-threatening complications; placental abnormalities wth subsequent pregnancies; incisional problems; blood clot disorders which may require blood thinners;, and other postoperative/anesthesia complications. The patient is in agreement with the proposed plan, and gives informed written consent for the procedure. All questions have been answered.   ARubie Maid MD [facility]

## 2015-11-15 LAB — CBC
HCT: 30.1 % — ABNORMAL LOW (ref 35.0–47.0)
Hemoglobin: 10.1 g/dL — ABNORMAL LOW (ref 12.0–16.0)
MCH: 30.7 pg (ref 26.0–34.0)
MCHC: 33.7 g/dL (ref 32.0–36.0)
MCV: 91 fL (ref 80.0–100.0)
PLATELETS: 146 10*3/uL — AB (ref 150–440)
RBC: 3.31 MIL/uL — AB (ref 3.80–5.20)
RDW: 13.9 % (ref 11.5–14.5)
WBC: 7.5 10*3/uL (ref 3.6–11.0)

## 2015-11-15 LAB — RPR: RPR: NONREACTIVE

## 2015-11-15 LAB — GLUCOSE, CAPILLARY
GLUCOSE-CAPILLARY: 90 mg/dL (ref 65–99)
GLUCOSE-CAPILLARY: 132 mg/dL — AB (ref 65–99)
Glucose-Capillary: 101 mg/dL — ABNORMAL HIGH (ref 65–99)

## 2015-11-15 MED ORDER — LACTATED RINGERS IV BOLUS (SEPSIS)
500.0000 mL | Freq: Once | INTRAVENOUS | Status: AC
  Administered 2015-11-15: 500 mL via INTRAVENOUS

## 2015-11-15 NOTE — Anesthesia Postprocedure Evaluation (Signed)
Anesthesia Post Note  Patient: Geophysical data processorharmanii Ghee  Procedure(s) Performed: Procedure(s) (LRB): REPEAT CESAREAN SECTION (N/A)  Patient location during evaluation: Mother Baby Anesthesia Type: Spinal Level of consciousness: awake, awake and alert, oriented and patient cooperative Pain management: satisfactory to patient Vital Signs Assessment: post-procedure vital signs reviewed and stable Respiratory status: spontaneous breathing and respiratory function stable Cardiovascular status: stable Postop Assessment: no headache, no backache, patient able to bend at knees, no signs of nausea or vomiting and adequate PO intake Anesthetic complications: no    Last Vitals:  Filed Vitals:   11/15/15 0450 11/15/15 0728  BP: 116/64 117/72  Pulse: 98 102  Temp: 36.6 C 36.7 C  Resp: 20 18    Last Pain:  Filed Vitals:   11/15/15 0830  PainSc: 0-No pain                 Melton KrebsPeralta,  Hezzie Karim R

## 2015-11-15 NOTE — Anesthesia Post-op Follow-up Note (Signed)
  Anesthesia Pain Follow-up Note  Patient: Kari Tanner  Day #: 1  Date of Follow-up: 11/15/2015 Time: 10:28 AM  Last Vitals:  Filed Vitals:   11/15/15 0450 11/15/15 0728  BP: 116/64 117/72  Pulse: 98 102  Temp: 36.6 C 36.7 C  Resp: 20 18    Level of Consciousness: alert  Pain: none   Side Effects:None  Catheter Site Exam:clean  Plan: D/C from anesthesia care  Melton KrebsPeralta,  Jearlean Demauro R

## 2015-11-15 NOTE — Progress Notes (Signed)
Patient's 0730 pulse was 102 and 1330 pulse was 126. Pulse was rechecked around 1445 and was 129. All other vitals have been WDL. Heart and lungs sound normal and within defined limits. Patient's urinary output since 0700 has been above 30 ml/hr. Dr. Valentino Saxonherry notified and 500ml bolus of LR is running.

## 2015-11-15 NOTE — Progress Notes (Signed)
Postpartum Day # 1: Cesarean Delivery (repeat)  Subjective: Patient reported nausea overnight, not relieved with Zofran but improved with Phenergan.  Feels better today.  Has not yet ambulated or voided yet (still has catheter in place due to decreased output overnight).    Objective: Vital signs in last 24 hours: Temp:  [97.5 F (36.4 C)-98.6 F (37 C)] 98 F (36.7 C) (03/14 0728) Pulse Rate:  [70-102] 102 (03/14 0728) Resp:  [14-20] 18 (03/14 0728) BP: (105-134)/(60-87) 117/72 mmHg (03/14 0728) SpO2:  [96 %-100 %] 99 % (03/14 0728)  I/O last 3 completed shifts: In: 3704 [P.O.:480; I.V.:3224] Out: 1840 [Urine:740; Emesis/NG output:400; Blood:700] Total I/O In: -  Out: 375 [Urine:375]   Physical Exam:  General: alert and no distress Lungs: clear to auscultation bilaterally Breasts: normal appearance, no masses or tenderness.  Patient currently pumping.  Heart: regular rate and rhythm, S1, S2 normal, no murmur, click, rub or gallop Pelvis: Lochia appropriate, Uterine Fundus firm, Incision: bandage with old dry blood.  Extremities: DVT Evaluation: Negative Homan's sign.  No cords or calf tenderness. No significant calf/ankle edema. SCDs in place.    Accucheck fasting this a.m. was 101.   Recent Labs  11/14/15 0833 11/15/15 0523  HGB 13.4 10.1*  HCT 39.0 30.1*    Assessment/Plan: Status post Cesarean section. Doing well postoperatively.  Breastfeeding and bottle (infant in NICU for monitoring glucose levels) Advance diet as tolerated.  Encourage ambulation.  Monitor blood sugars x 1 day (qAC and qhs) Continue PO pain management Remove foley catheter Discontinue IVF once tolerating regular diet.  Continue current care. Desires OCPs for contraception. Mild anemia due to anticipated surgical blood loss.  Asymptomatic.  Can treat with PO iron.   Dispo: d/c home in 1-2 days.   Hildred LaserAnika Karis Emig [facility]

## 2015-11-16 LAB — GLUCOSE, CAPILLARY
GLUCOSE-CAPILLARY: 103 mg/dL — AB (ref 65–99)
GLUCOSE-CAPILLARY: 125 mg/dL — AB (ref 65–99)
GLUCOSE-CAPILLARY: 146 mg/dL — AB (ref 65–99)
GLUCOSE-CAPILLARY: 98 mg/dL (ref 65–99)
Glucose-Capillary: 87 mg/dL (ref 65–99)
Glucose-Capillary: 123 mg/dL — ABNORMAL HIGH (ref 65–99)
Glucose-Capillary: 100 mg/dL — ABNORMAL HIGH (ref 65–99)

## 2015-11-16 MED ORDER — OXYCODONE-ACETAMINOPHEN 5-325 MG PO TABS: 1.0000 | ORAL_TABLET | Freq: Four times a day (QID) | ORAL | Status: DC | PRN

## 2015-11-16 MED ORDER — IBUPROFEN 800 MG PO TABS: 800.0000 mg | ORAL_TABLET | Freq: Three times a day (TID) | ORAL | Status: DC | PRN

## 2015-11-16 MED ORDER — DOCUSATE SODIUM 100 MG PO CAPS: 100.0000 mg | ORAL_CAPSULE | Freq: Two times a day (BID) | ORAL | Status: DC | PRN

## 2015-11-16 NOTE — Progress Notes (Signed)
Postpartum Day # 2: Cesarean Delivery (repeat)  Subjective: Patient doing well, denies complaints.  Notes pain increased overnight but is better today.    Objective: Vital signs in last 24 hours: Temp:  [98.3 F (36.8 C)-98.9 F (37.2 C)] 98.7 F (37.1 C) (03/15 0752) Pulse Rate:  [108-129] 110 (03/15 0752) Resp:  [14-20] 18 (03/15 0752) BP: (117-137)/(69-78) 117/75 mmHg (03/15 0752) SpO2:  [97 %-99 %] 98 % (03/15 0355)  Physical Exam:  General: alert and no distress Lungs: clear to auscultation bilaterally Breasts: normal appearance, no masses or tenderness.  Patient currently pumping.  Heart: regular rate and rhythm, S1, S2 normal, no murmur, click, rub or gallop Pelvis: Lochia appropriate, Uterine Fundus firm, Incision: bandage with old dry blood.  Extremities: DVT Evaluation: Negative Homan's sign.  No cords or calf tenderness. No significant calf/ankle edema. SCDs in place.    Accuchecks:  Results for Doree AlbeeVILLATORO, Janelle (MRN [id number]) as of 11/16/2015 15:42  Ref. Range 11/15/2015 08:07 11/15/2015 13:12 11/15/2015 16:13 11/15/2015 20:00 11/16/2015 00:48  Glucose-Capillary Latest Ref Range: 65-99 mg/dL 914101 (H) 90 782132 (H) 956146 (H) 98    Recent Labs  11/14/15 0833 11/15/15 0523  HGB 13.4 10.1*  HCT 39.0 30.1*    Assessment/Plan: Status post Cesarean section. Doing well postoperatively.  Breastfeeding and bottle (infant in NICU for monitoring glucose levels) Continue regular diet and ambulation.   Blood sugars mildly elevated postpartum.  No need to resume medications currently.  For 2 hr GTT postpartum.  Continue PO pain management. Continue current care. Desires OCPs for contraception. Mild anemia.  Asymptomatic.  Can treat with PO iron.   Dispo: d/c home later this evening or tomorrow (pending infant disposition).    Hildred LaserAnika Arpan Eskelson [facility]

## 2015-11-16 NOTE — Discharge Instructions (Signed)
Cesarean Delivery, Care After  Refer to this sheet in the next few weeks. These instructions provide you with information on caring for yourself after your procedure. Your health care provider may also give you specific instructions. Your treatment has been planned according to current medical practices, but problems sometimes occur. Call your health care provider if you have any problems or questions after you go home.  HOME CARE INSTRUCTIONS   Only take over-the-counter or prescription medications as directed by your health care provider.   Do not drink alcohol, especially if you are breastfeeding or taking medication to relieve pain.   Do not chew or smoke tobacco.   Continue to use good perineal care. Good perineal care includes:    Wiping your perineum from front to back.    Keeping your perineum clean.   Check your surgical cut (incision) daily for increased redness, drainage, swelling, or separation of skin.   Clean your incision gently with soap and water every day, and then pat it dry. If your health care provider says it is okay, leave the incision uncovered. Use a bandage (dressing) if the incision is draining fluid or appears irritated. If the adhesive strips across the incision do not fall off within 7 days, carefully peel them off.   Hug a pillow when coughing or sneezing until your incision is healed. This helps to relieve pain.   Do not use tampons or douche until your health care provider says it is okay.   Shower, wash your hair, and take tub baths as directed by your health care provider.   Wear a well-fitting bra that provides breast support.   Limit wearing support panties or control-top hose.   Drink enough fluids to keep your urine clear or pale yellow.   Eat high-fiber foods such as whole grain cereals and breads, brown rice, beans, and fresh fruits and vegetables every day. These foods may help prevent or relieve constipation.   Resume activities such as climbing stairs,  driving, lifting, exercising, or traveling as directed by your health care provider.   Talk to your health care provider about resuming sexual activities. This is dependent upon your risk of infection, your rate of healing, and your comfort and desire to resume sexual activity.   Try to have someone help you with your household activities and your newborn for at least a few days after you leave the hospital.   Rest as much as possible. Try to rest or take a nap when your newborn is sleeping.   Increase your activities gradually.   Keep all of your scheduled postpartum appointments. It is very important to keep your scheduled follow-up appointments. At these appointments, your health care provider will be checking to make sure that you are healing physically and emotionally.  SEEK MEDICAL CARE IF:    You are passing large clots from your vagina. Save any clots to show your health care provider.   You have a foul smelling discharge from your vagina.   You have trouble urinating.   You are urinating frequently.   You have pain when you urinate.   You have a change in your bowel movements.   You have increasing redness, pain, or swelling near your incision.   You have pus draining from your incision.   Your incision is separating.   You have painful, hard, or reddened breasts.   You have a severe headache.   You have blurred vision or see spots.   You feel sad   or depressed.   You have thoughts of hurting yourself or your newborn.   You have questions about your care, the care of your newborn, or medications.   You are dizzy or light-headed.   You have a rash.   You have pain, redness, or swelling at the site of the removed intravenous access (IV) tube.   You have nausea or vomiting.   You stopped breastfeeding and have not had a menstrual period within 12 weeks of stopping.   You are not breastfeeding and have not had a menstrual period within 12 weeks of delivery.   You have a fever.  SEEK  IMMEDIATE MEDICAL CARE IF:   You have persistent pain.   You have chest pain.   You have shortness of breath.   You faint.   You have leg pain.   You have stomach pain.   Your vaginal bleeding saturates 2 or more sanitary pads in 1 hour.  MAKE SURE YOU:    Understand these instructions.   Will watch your condition.   Will get help right away if you are not doing well or get worse.     This information is not intended to replace advice given to you by your health care provider. Make sure you discuss any questions you have with your health care provider.     Document Released: 05/12/2002 Document Revised: 09/10/2014 Document Reviewed: 04/16/2012  Elsevier Interactive Patient Education 2016 [employer]

## 2015-11-16 NOTE — Progress Notes (Deleted)
Discharge instructions complete and prescriptions given. Patient verbalizes understanding of teaching. Patient discharged home at 1140. 

## 2015-11-17 LAB — GLUCOSE, CAPILLARY: GLUCOSE-CAPILLARY: 118 mg/dL — AB (ref 65–99)

## 2015-11-17 NOTE — Progress Notes (Signed)
Postpartum Day # 3: Cesarean Delivery (repeat)  Subjective:  Patient doing well, denies complaints.  Pain well controlled. Tolerating regular diet and ambulating without difficulty. In nursery visiting infant.   Objective: Vital signs in last 24 hours: Temp:  [98.5 F (36.9 C)-99 F (37.2 C)] 98.5 F (36.9 C) (03/16 0354) Pulse Rate:  [106-118] 106 (03/15 2057) Resp:  [18-20] 20 (03/15 2057) BP: (113-134)/(62-73) 134/73 mmHg (03/15 2057) SpO2:  [100 %] 100 % (03/15 2057)  Physical Exam:  General: alert and no distress Lungs: clear to auscultation bilaterally Breasts: normal appearance, no masses or tenderness.  Patient currently pumping.  Heart: regular rate and rhythm, S1, S2 normal, no murmur, click, rub or gallop Pelvis: Lochia appropriate, Uterine Fundus firm, Incision: bandage clean and dry.  Extremities: DVT Evaluation: Negative Homan's sign.  No cords or calf tenderness. No significant calf/ankle edema. SCDs in place.    Accuchecks:  Results for Doree AlbeeVILLATORO, Mayleigh (MRN [id number]) as of 11/17/2015 08:06  Ref. Range 11/16/2015 07:51 11/16/2015 12:00 11/16/2015 16:25 11/16/2015 21:57 11/17/2015 07:39  Glucose-Capillary Latest Ref Range: 65-99 mg/dL 119123 (H) 147100 (H) 829125 (H) 103 (H) 118 (H)    Recent Labs  11/14/15 0833 11/15/15 0523  HGB 13.4 10.1*  HCT 39.0 30.1*    Assessment/Plan: Status post Cesarean section. Doing well postoperatively.  Breastfeeding and bottle (infant in NICU for monitoring glucose levels) Continue regular diet and ambulation.   Blood sugars mildly elevated postpartum.  No need to resume medications currently.  For 2 hr GTT postpartum.  Desires OCPs for contraception. Mild anemia.  Asymptomatic.  Can treat with PO iron.   Dispo: d/c home today. RTC in 1 week for staple removal.    Hildred LaserAnika Dwain Huhn [facility]

## 2015-11-17 NOTE — Discharge Summary (Signed)
Obstetric Discharge Summary Reason for Admission: cesarean section, uncontrolled gestational diabetes, suspected LGA infant Prenatal Procedures: NST and ultrasound Intrapartum Procedures: cesarean: low cervical, transverse Postpartum Procedures: none Complications-Operative and Postpartum: none HEMOGLOBIN  Date Value Ref Range Status  11/15/2015 10.1* 12.0 - 16.0 g/dL Final   HGB  Date Value Ref Range Status  06/14/2013 13.1 12.0-16.0 g/dL Final   HCT  Date Value Ref Range Status  11/15/2015 30.1* 35.0 - 47.0 % Final  06/14/2013 37.1 35.0-47.0 % Final   HEMATOCRIT  Date Value Ref Range Status  04/22/2015 36.5 34.0 - 46.6 % Final    Physical Exam:  General: alert and no distress Lochia: appropriate Uterine Fundus: firm Incision: healing well, no significant drainage, no dehiscence, no significant erythema DVT Evaluation: Negative Homan's sign. No cords or calf tenderness. No significant calf/ankle edema.  Discharge Diagnoses: Term Pregnancy-delivered, Gestational Diabetes (Class A2), LGA infant (fetal macrosomia)  Discharge Information: Date: 11/17/2015 Activity: pelvic rest Diet: routine Medications: PNV, Ibuprofen, Colace and Percocet Condition: stable Instructions: refer to practice specific booklet Discharge to: home Follow-up Information    Follow up with Hildred LaserAnika Caelynn Marshman, MD In 1 week.   Specialties:  Obstetrics and Gynecology, Radiology   Why:  For wound re-check   Contact information:   [address] [place] [address] [phone]       Newborn Data: Live born female  Birth Weight: 9 lb 9.8 oz (4360 g) APGAR: 8, 9  Home with mother.  Hildred Lasernika [place] Fant 11/17/2015, 8:09 AM

## 2015-11-22 ENCOUNTER — Ambulatory Visit (INDEPENDENT_AMBULATORY_CARE_PROVIDER_SITE_OTHER): Payer: Medicaid Other | Admitting: Obstetrics and Gynecology

## 2015-11-22 VITALS — BP 108/72 | HR 86 | Wt 237.9 lb

## 2015-11-22 DIAGNOSIS — Z98891 History of uterine scar from previous surgery: Secondary | ICD-10-CM

## 2015-11-22 DIAGNOSIS — Z5189 Encounter for other specified aftercare: Secondary | ICD-10-CM

## 2015-11-22 NOTE — Progress Notes (Signed)
    OBSTETRIC CLINIC POST-OPERATIVE VISIT  Subjective:     Kari Tanner is a 30 y.o. [id number] female who presents to the clinic 1 weeks status post  repeat low transverse C-section for history of prior C-section and LGA infant at [redacted] weeks gestation. Eating a regular diet without difficulty. Bowel movements are normal. Pain is controlled with current analgesics. Medications being used: prescription NSAID's including ibuprofen (Motrin).  The following portions of the patient's history were reviewed and updated as appropriate: allergies, current medications, past family history, past medical history, past social history, past surgical history and problem list.  Review of Systems Pertinent items noted in HPI and remainder of comprehensive ROS otherwise negative.    Objective:    BP 108/72 mmHg  Pulse 86  Wt 237 lb 14.4 oz (107.911 kg)  LMP 02/26/2015  Breastfeeding? Yes General:  alert and no distress  Abdomen: soft, bowel sounds active, non-tender  Incision:   healing well, no drainage, no erythema, no hernia, no seroma, no swelling, skin edges on left lateral side superimposed, no dehiscence     Assessment:    Doing well postoperatively. S/p C-section  Gestational diabetes (uncontrolled)  Plan:    1. Continue any current medications. 2. Wound care discussed.  Removed honeycomb dressing, staples removed.  Placed steri-strips.  3. Activity restrictions: no bending, stooping, or squatting, no lifting more than 15 pounds and continue pelvic rest 4. Anticipated return to work: 5 weeks. 5. Follow up: 5 weeks for postpartum/final post-op check.  Will need 2 hr postpartum glucola at that time.   Hildred LaserAnika Breckin Zafar, MD [facility]

## 2015-12-10 ENCOUNTER — Emergency Department
Admission: EM | Admit: 2015-12-10 | Discharge: 2015-12-10 | Disposition: A | Discharge status: Home / Self Care | Payer: Medicaid Other | Attending: Emergency Medicine | Admitting: Emergency Medicine

## 2015-12-10 ENCOUNTER — Encounter: Payer: Self-pay | Admitting: Emergency Medicine

## 2015-12-10 DIAGNOSIS — O24439 Gestational diabetes mellitus in the puerperium, unspecified control: Secondary | ICD-10-CM | POA: Diagnosis not present

## 2015-12-10 DIAGNOSIS — B9689 Other specified bacterial agents as the cause of diseases classified elsewhere: Secondary | ICD-10-CM | POA: Diagnosis not present

## 2015-12-10 DIAGNOSIS — O864 Pyrexia of unknown origin following delivery: Secondary | ICD-10-CM | POA: Diagnosis present

## 2015-12-10 DIAGNOSIS — Z87891 Personal history of nicotine dependence: Secondary | ICD-10-CM | POA: Diagnosis not present

## 2015-12-10 DIAGNOSIS — O9123 Nonpurulent mastitis associated with lactation: Secondary | ICD-10-CM | POA: Diagnosis not present

## 2015-12-10 DIAGNOSIS — N61 Mastitis without abscess: Secondary | ICD-10-CM

## 2015-12-10 MED ORDER — ACETAMINOPHEN 325 MG PO TABS
650.0000 mg | ORAL_TABLET | Freq: Once | ORAL | Status: AC | PRN
  Administered 2015-12-10: 650 mg via ORAL
  Filled 2015-12-10: qty 2

## 2015-12-10 MED ORDER — CEPHALEXIN 500 MG PO CAPS: 500.0000 mg | ORAL_CAPSULE | Freq: Four times a day (QID) | ORAL | Status: AC

## 2015-12-10 NOTE — ED Notes (Addendum)
Patient is 3 weeks post partum, Patient c/o fever, chills, and left breast pain. Patient is currently breast feeding. States that breast is very painful and is warm to touch. Symptoms started yesterday.

## 2015-12-10 NOTE — Discharge Instructions (Signed)
Mastitis  Mastitis is inflammation of the breast tissue. It occurs most often in women who are breastfeeding, but it can also affect other women, and even sometimes men.  CAUSES   Mastitis is usually caused by a bacterial infection. Bacteria enter the breast tissue through cuts or openings in the skin. Typically, this occurs with breastfeeding because of cracked or irritated skin. Sometimes, it can occur even when there is no opening in the skin. It can be associated with plugged milk (lactiferous) ducts. Nipple piercing can also lead to mastitis. Also, some forms of breast cancer can cause mastitis.  SIGNS AND SYMPTOMS   · Swelling, redness, tenderness, and pain in an area of the breast.  · Swelling of the glands under the arm on the same side.  · Fever.  If an infection is allowed to progress, a collection of pus (abscess) may develop.  DIAGNOSIS   Your health care provider can usually diagnose mastitis based on your symptoms and a physical exam. Tests may be done to help confirm the diagnosis. These may include:   · Removal of pus from the breast by applying pressure to the area. This pus can be examined in the lab to determine which bacteria are present. If an abscess has developed, the fluid in the abscess can be removed with a needle. This can also be used to confirm the diagnosis and determine the bacteria present. In most cases, pus will not be present.  · Blood tests to determine if your body is fighting a bacterial infection.  · Mammogram or ultrasound tests to rule out other problems or diseases.  TREATMENT   Antibiotic medicine is used to treat a bacterial infection. Your health care provider will determine which bacteria are most likely causing the infection and will select an appropriate antibiotic. This is sometimes changed based on the results of tests performed to identify the bacteria, or if there is no response to the antibiotic selected. Antibiotics are usually given by mouth. You may also be  given medicine for pain.  Mastitis that occurs with breastfeeding will sometimes go away on its own, so your health care provider may choose to wait 24 hours after first seeing you to decide whether a prescription medicine is needed.  HOME CARE INSTRUCTIONS   · Only take over-the-counter or prescription medicines for pain, fever, or discomfort as directed by your health care provider.  · If your health care provider prescribed an antibiotic, take the medicine as directed. Make sure you finish it even if you start to feel better.  · Do not wear a tight or underwire bra. Wear a soft, supportive bra.  · Increase your fluid intake, especially if you have a fever.  · Women who are breastfeeding should follow these instructions:    Continue to empty the breast. Your health care provider can tell you whether this milk is safe for your infant or needs to be thrown out. You may be told to stop nursing until your health care provider thinks it is safe for your baby. Use a breast pump if you are advised to stop nursing.    Keep your nipples clean and dry.    Empty the first breast completely before going to the other breast. If your baby is not emptying your breasts completely for some reason, use a breast pump to empty your breasts.    If you go back to work, pump your breasts while at work to stay in time with your nursing schedule.      Avoid allowing your breasts to become overly filled with milk (engorged).  SEEK MEDICAL CARE IF:   · You have pus-like discharge from the breast.  · Your symptoms do not improve with the treatment prescribed by your health care provider within 2 days.  SEEK IMMEDIATE MEDICAL CARE IF:   · Your pain and swelling are getting worse.  · You have pain that is not controlled with medicine.  · You have a red line extending from the breast toward your armpit.  · You have a fever or persistent symptoms for more than 2-3 days.  · You have a fever and your symptoms suddenly get worse.     This information  is not intended to replace advice given to you by your health care provider. Make sure you discuss any questions you have with your health care provider.     Document Released: 08/20/2005 Document Revised: 08/25/2013 Document Reviewed: 03/20/2013  Elsevier Interactive Patient Education ©2016 [employer]

## 2015-12-10 NOTE — ED Provider Notes (Signed)
[facility] Emergency Department Provider Note  ____________________________________________  Time seen: Approximately 12:38 PM  I have reviewed the triage vital signs and the nursing notes.   HISTORY  Chief Complaint Fever    HPI Kari Tanner is a 30 y.o. female , NAD, presents to the emergency department with a 24-hour history of fever, chills, left breast pain. States she is 3 weeks postpartum from a cesarean section. Is breast-feeding her child has noted decreased milk about the left breast since the swelling has begun. Has not noted any skin sores or open wounds. No oozing or weeping. Has taken over-the-counter Tylenol and ibuprofen which is held with fever and aches. Denies chest pain, shortness of breath, abdominal pain, nausea, vomiting.   Past Medical History  Diagnosis Date  . History of miscarriage, currently pregnant   . Ovarian cyst   . Diabetes mellitus without complication (HCC)   . Obesity affecting pregnancy 2016    Patient Active Problem List   Diagnosis Date Noted  . S/P cesarean section 11/14/2015  . Gestational diabetes mellitus 10/21/2015  . Labor and delivery, indication for care 10/11/2015  . Polyhydramnios 09/28/2015  . Supervision of high risk pregnancy in third trimester 08/16/2015  . Diabetes mellitus during pregnancy, antepartum 07/04/2015  . History of preterm delivery, currently pregnant in third trimester 07/04/2015  . Obesity affecting pregnancy, antepartum 07/04/2015    Past Surgical History  Procedure Laterality Date  . C sections    . Ovarian cyst surgery Right 2007  . Cesarean section    . Cesarean section N/A 11/14/2015    Procedure: REPEAT CESAREAN SECTION;  Surgeon: Hildred Laser, MD;  Location: [facility] ORS;  Service: Obstetrics;  Laterality: N/A;    Current Outpatient Rx  Name  Route  Sig  Dispense  Refill  . cephALEXin (KEFLEX) 500 MG capsule   Oral   Take 1 capsule (500 mg total) by mouth 4 (four)  times daily.   56 capsule   0   . folic acid (FOLVITE) 1 MG tablet            1   . Prenatal Vit-Fe Fumarate-FA (PRENATAL VITAMIN) 27-0.8 MG TABS   Oral   Take by mouth.           Allergies Review of patient's allergies indicates no known allergies.  Family History  Problem Relation Age of Onset  . Diabetes Mother   . Hypertension Mother   . Hypertension Paternal Grandmother   . Diabetes Maternal Aunt   . Diabetes Maternal Grandfather     Social History Social History  Substance Use Topics  . Smoking status: Former Games developer  . Smokeless tobacco: Never Used  . Alcohol Use: No     Review of Systems  Constitutional: Positive fever, chills. No fatigue. Cardiovascular: No chest pain. Respiratory:  No shortness of breath. No wheezing.  Gastrointestinal: No abdominal pain.  No nausea, vomiting. Musculoskeletal: Positive left breast pain. Negative for back pain.  Skin: Positive redness, swelling left breast about the nipple. Negative for rash, skin sores, oozing, weeping. Neurological: Negative for headaches, focal weakness or numbness. 10-point ROS otherwise negative.  ____________________________________________   PHYSICAL EXAM:  VITAL SIGNS: ED Triage Vitals  Enc Vitals Group     BP 12/10/15 1109 118/77 mmHg     Pulse Rate 12/10/15 1109 98     Resp 12/10/15 1109 20     Temp 12/10/15 1109 101.5 F (38.6 C)     Temp src --  SpO2 12/10/15 1109 99 %     Weight 12/10/15 1109 221 lb (100.245 kg)     Height 12/10/15 1109 5\' 3"  (1.6 m)     Head Cir --      Peak Flow --      Pain Score 12/10/15 1107 4     Pain Loc --      Pain Edu? --      Excl. in GC? --     Physical exam completely in the presence of Mable FillPaige Owczarczak, PA-S  Constitutional: Alert and oriented. Well appearing and in no acute distress. Eyes: Conjunctivae are normal.  Head: Atraumatic. Neck: Supple with full range of motion. Hematological/Lymphatic/Immunilogical: No cervical  lymphadenopathy. Cardiovascular: Normal rate, regular rhythm. Normal S1 and S2.  Good peripheral circulation. Respiratory: Normal respiratory effort without tachypnea or retractions. Lungs CTAB. Neurologic:  Normal speech and language. No gross focal neurologic deficits are appreciated.  Skin:  Skin surrounding the areola of the left breast with mild erythema and warmth to palpation. Trace induration noted to palpation about the 2:00 position of the left breast. No oozing or weeping about the nipple. No skin sores noted. Right breast without abnormality.  Psychiatric: Mood and affect are normal. Speech and behavior are normal. Patient exhibits appropriate insight and judgement.   ____________________________________________   LABS  None  ____________________________________________  EKG  None ____________________________________________  RADIOLOGY  None ____________________________________________    PROCEDURES  Procedure(s) performed: None    Medications  acetaminophen (TYLENOL) tablet 650 mg (650 mg Oral Given 12/10/15 1115)     ____________________________________________   INITIAL IMPRESSION / ASSESSMENT AND PLAN / ED COURSE  Patient's diagnosis is consistent with acute left mastitis. Patient will be discharged home with prescriptions for Keflex to be taken as prescribed. Patient was advised to continue breast-feeding per usual and follow exit care as given.. Patient is to follow up with her primary care provider or OB/GYN if symptoms persist past this treatment course. Patient is given ED precautions to return to the ED for any worsening or new symptoms.    ____________________________________________  FINAL CLINICAL IMPRESSION(S) / ED DIAGNOSES  Final diagnoses:  Mastitis, left, acute      NEW MEDICATIONS STARTED DURING THIS VISIT:  Discharge Medication List as of 12/10/2015 12:41 PM    START taking these medications   Details  cephALEXin (KEFLEX) 500  MG capsule Take 1 capsule (500 mg total) by mouth 4 (four) times daily., Starting 12/10/2015, Until Sat 12/24/15, Print             Ernestene KielJami L TurnerHagler, PA-C 12/10/15 1405  Myrna Blazeravid Matthew Schaevitz, MD 12/10/15 [phone]

## 2015-12-10 NOTE — ED Notes (Addendum)
Pt to ed with c/o fever and chills, reports redness, swelling and pain to left breast.  Reports she is breast feeding and is 3 weeks post op c section.  Pt denies abd pain.  Last dose of IBU was 800mg  at 2am last night.

## 2015-12-27 ENCOUNTER — Ambulatory Visit: Payer: Medicaid Other | Admitting: Obstetrics and Gynecology

## 2015-12-28 ENCOUNTER — Ambulatory Visit: Payer: Medicaid Other | Admitting: Obstetrics and Gynecology

## 2015-12-29 ENCOUNTER — Ambulatory Visit (INDEPENDENT_AMBULATORY_CARE_PROVIDER_SITE_OTHER): Payer: Medicaid Other | Admitting: Obstetrics and Gynecology

## 2015-12-29 ENCOUNTER — Encounter: Payer: Self-pay | Admitting: Obstetrics and Gynecology

## 2015-12-29 ENCOUNTER — Other Ambulatory Visit: Payer: Self-pay | Admitting: Obstetrics and Gynecology

## 2015-12-29 ENCOUNTER — Other Ambulatory Visit: Payer: Medicaid Other

## 2015-12-29 DIAGNOSIS — Z30011 Encounter for initial prescription of contraceptive pills: Secondary | ICD-10-CM

## 2015-12-29 DIAGNOSIS — O24414 Gestational diabetes mellitus in pregnancy, insulin controlled: Secondary | ICD-10-CM

## 2015-12-29 MED ORDER — NORETHINDRONE 0.35 MG PO TABS: 1.0000 | ORAL_TABLET | Freq: Every day | ORAL | Status: DC

## 2015-12-29 NOTE — Progress Notes (Signed)
   OBSTETRICS POSTPARTUM CLINIC PROGRESS NOTE  Subjective:     Kari Tanner is a 30 y.o. [phone] female who presents for a postpartum visit. She is 6 weeks postpartum following a low cervical transverse Cesarean section. I have fully reviewed the prenatal and intrapartum course. The delivery was at 37 gestational weeks for uncontrolled Class A2 GDM.  Anesthesia: spinal. Postpartum course has been well, noted postpartum blues initially, however this resolved by 2 weeks. Baby's course has been well. Baby is feeding by breast and bottle. Bleeding: patient has not resumed menses, with No LMP recorded.. Bowel function is normal. Bladder function is normal. Patient is sexually active. Contraception method desired is oral progesterone-only contraceptive. Postpartum depression screening: negative (PHQ-9 score is 3).  The following portions of the patient's history were reviewed and updated as appropriate: allergies, current medications, past family history, past medical history, past social history, past surgical history and problem list.  Review of Systems Pertinent items noted in HPI and remainder of comprehensive ROS otherwise negative.   Objective:    BP 108/73 mmHg  Pulse 80  Wt 229 lb 9.6 oz (104.146 kg)  Breastfeeding? Yes  General:  alert and no distress   Breasts:  inspection negative, no nipple discharge or bleeding, no masses or nodularity palpable  Lungs: clear to auscultation bilaterally  Heart:  regular rate and rhythm, S1, S2 normal, no murmur, click, rub or gallop  Abdomen: soft, non-tender; bowel sounds normal; no masses,  no organomegaly.  Well healed Pfannenstiel incision   Vulva:  normal  Vagina: normal vagina, no discharge, exudate, lesion, or erythema  Cervix:  no cervical motion tenderness and no lesions  Corpus: normal size, contour, position, consistency, mobility, non-tender  Adnexa:  normal adnexa and no mass, fullness, tenderness  Rectal Exam: Not performed.        Labs:  Lab Results  Component Value Date   HGB 10.1* 11/15/2015     Assessment:    Routine postpartum exam. S/p repeat C-section for LGA infant and prior h/o C-section    GDM Class A2, uncontrolled during pregnancy    Plan:    1. Contraception: oral progesterone-only contraceptive.  Stressed the importance of taking pill at same time every day.  Can initiate Sunday start.   2. Performing 2 hr postpartum glucola to assess for residual diabetes.  3. Can resume all activities, including exercise, work, and coitus.  4. Follow up in: 3 months for annual exam, or as needed.    Hildred LaserAnika Pocahontas Cohenour, MD [facility]

## 2015-12-30 LAB — GLUCOSE TOLERANCE, 2 HOURS W/ 1HR
Glucose, 1 hour: 110 mg/dL (ref 65–179)
Glucose, 2 hour: 92 mg/dL (ref 65–152)
Glucose, Fasting: 82 mg/dL (ref 65–91)

## 2016-01-03 ENCOUNTER — Telehealth: Payer: Self-pay

## 2016-01-03 NOTE — Telephone Encounter (Signed)
Called pt informed her of information below. Pt gave verbal understanding.  

## 2016-01-03 NOTE — Telephone Encounter (Signed)
-----   Message from Hildred LaserAnika Cherry, MD sent at 01/03/2016  8:02 AM EDT ----- Please inform patient that 2 hr glucola was normal, however she is still at risk for developing diabetes in the future.  Encourage preventative measures such as healthier diet, engaging in exercise at least 3-5 times per week for at least 30 min.

## 2016-01-17 ENCOUNTER — Telehealth: Payer: Self-pay | Admitting: Obstetrics and Gynecology

## 2016-01-17 NOTE — Telephone Encounter (Signed)
Dynastee called and left msg. She only said she wanted a nurse to call her. Sorry

## 2016-01-17 NOTE — Telephone Encounter (Signed)
Called pt she states that she delivered 2 months ago but is still experiencing heavy bleeding. PT states that the bleeding stopped for several days and then returned, pt states then the pattern repeated had heavy bleeding then stopped, now bleeding has now returned. Very heavy. Pt is unsure what to do and when to start OCPs. Please advise. Pt is breastfeeding.

## 2016-01-17 NOTE — Telephone Encounter (Signed)
Please inform patient that if she has been sexually active without contraception, she should take a pregnancy test and then begin OCPs.  This will help regulate her cycles.

## 2016-01-18 NOTE — Telephone Encounter (Signed)
Called pt advised her to take UPT and then begin OCPs. Pt gave verbal understanding.

## 2016-10-12 DIAGNOSIS — R102 Pelvic and perineal pain: Secondary | ICD-10-CM | POA: Insufficient documentation

## 2016-10-12 DIAGNOSIS — E1165 Type 2 diabetes mellitus with hyperglycemia: Secondary | ICD-10-CM | POA: Diagnosis not present

## 2016-10-12 DIAGNOSIS — R1031 Right lower quadrant pain: Secondary | ICD-10-CM | POA: Diagnosis present

## 2016-10-12 DIAGNOSIS — Z87891 Personal history of nicotine dependence: Secondary | ICD-10-CM | POA: Insufficient documentation

## 2016-10-12 NOTE — ED Triage Notes (Signed)
Pt ambulatory to triage with no difficulty. Pt reports pain to her RLQ x 3 days. Hx of ovarian cyst and this feels similar. LMP  10/08/16. 10 months post partum.

## 2016-10-13 ENCOUNTER — Emergency Department: Payer: 59

## 2016-10-13 ENCOUNTER — Emergency Department
Admission: EM | Admit: 2016-10-13 | Discharge: 2016-10-13 | Disposition: A | Discharge status: Home / Self Care | Payer: 59 | Attending: Emergency Medicine | Admitting: Emergency Medicine

## 2016-10-13 DIAGNOSIS — R739 Hyperglycemia, unspecified: Secondary | ICD-10-CM

## 2016-10-13 DIAGNOSIS — R1031 Right lower quadrant pain: Secondary | ICD-10-CM

## 2016-10-13 DIAGNOSIS — R102 Pelvic and perineal pain: Secondary | ICD-10-CM

## 2016-10-13 LAB — URINALYSIS, COMPLETE (UACMP) WITH MICROSCOPIC
Bacteria, UA: NONE SEEN
Bilirubin Urine: NEGATIVE
Glucose, UA: >=500 mg/dL — AB
Hgb urine dipstick: NEGATIVE
Ketones, ur: 5 mg/dL — AB
Leukocytes, UA: NEGATIVE
Nitrite: NEGATIVE
PH: 5 (ref 5.0–8.0)
Protein, ur: NEGATIVE mg/dL
SPECIFIC GRAVITY, URINE: 1.027 (ref 1.005–1.030)

## 2016-10-13 LAB — COMPREHENSIVE METABOLIC PANEL
ALK PHOS: 80 U/L (ref 38–126)
ALT: 21 U/L (ref 14–54)
AST: 18 U/L (ref 15–41)
Albumin: 4 g/dL (ref 3.5–5.0)
Anion gap: 7 (ref 5–15)
BUN: 18 mg/dL (ref 6–20)
CALCIUM: 9.7 mg/dL (ref 8.9–10.3)
CHLORIDE: 103 mmol/L (ref 101–111)
CO2: 28 mmol/L (ref 22–32)
CREATININE: 1.09 mg/dL — AB (ref 0.44–1.00)
GFR calc Af Amer: >60 mL/min (ref >60)
Glucose, Bld: 234 mg/dL — ABNORMAL HIGH (ref 65–99)
Potassium: 4 mmol/L (ref 3.5–5.1)
SODIUM: 138 mmol/L (ref 135–145)
Total Bilirubin: 0.5 mg/dL (ref 0.3–1.2)
Total Protein: 7.8 g/dL (ref 6.5–8.1)

## 2016-10-13 LAB — CBC
HCT: 35.4 % (ref 35.0–47.0)
Hemoglobin: 12.2 g/dL (ref 12.0–16.0)
MCH: 30.4 pg (ref 26.0–34.0)
MCHC: 34.5 g/dL (ref 32.0–36.0)
MCV: 88.2 fL (ref 80.0–100.0)
PLATELETS: 324 10*3/uL (ref 150–440)
RBC: 4.01 MIL/uL (ref 3.80–5.20)
RDW: 12.6 % (ref 11.5–14.5)
WBC: 6.7 10*3/uL (ref 3.6–11.0)

## 2016-10-13 LAB — LIPASE, BLOOD: LIPASE: 27 U/L (ref 11–51)

## 2016-10-13 LAB — POCT PREGNANCY, URINE: Preg Test, Ur: NEGATIVE

## 2016-10-13 MED ORDER — METFORMIN HCL 500 MG PO TABS: 500.0000 mg | ORAL_TABLET | Freq: Two times a day (BID) | ORAL | 0 refills | Status: DC

## 2016-10-13 NOTE — ED Notes (Signed)
Patient transported to Ultrasound 

## 2016-10-13 NOTE — ED Notes (Signed)
Patient given apple juice

## 2016-10-13 NOTE — ED Provider Notes (Signed)
[facility] Provider [facility] _   First MD Initiated Contact with Patient 10/13/16 0110     (approximate)  I have reviewed the triage vital signs and the nursing notes.   HISTORY  Chief Complaint Abdominal Pain   HPI Kari Tanner is a 31 y.o. female presents with mild right lower quadrant abdominal pain 3 days. Patient states that she's had an ovarian cyst in the past and symptoms are consistent with that. Patient states her current pain score is 2 out of 10. Patient denies any dysuria no vomiting or diarrhea. Patient denies any fever. Patient states that "feels like something coming out when she urinates.   Past Medical History:  Diagnosis Date  . Diabetes mellitus without complication (HCC)   . History of miscarriage, currently pregnant   . Obesity affecting pregnancy 2016  . Ovarian cyst     Patient Active Problem List   Diagnosis Date Noted  . S/P cesarean section 11/14/2015  . Gestational diabetes mellitus 10/21/2015  . Labor and delivery, indication for care 10/11/2015  . Polyhydramnios 09/28/2015  . Supervision of high risk pregnancy in third trimester 08/16/2015  . Diabetes mellitus during pregnancy, antepartum 07/04/2015  . History of preterm delivery, currently pregnant in third trimester 07/04/2015  . Obesity affecting pregnancy, antepartum 07/04/2015    Past Surgical History:  Procedure Laterality Date  . c sections    . CESAREAN SECTION    . CESAREAN SECTION N/A 11/14/2015   Procedure: REPEAT CESAREAN SECTION;  Surgeon: Hildred LaserAnika Cherry, MD;  Location: [facility] ORS;  Service: Obstetrics;  Laterality: N/A;  . OVARIAN CYST SURGERY Right 2007    Prior to Admission medications   Medication Sig Start Date End Date Taking? Authorizing Provider  norethindrone (MICRONOR,CAMILA,ERRIN) 0.35 MG tablet Take 1 tablet (0.35 mg total) by mouth daily. 12/29/15   Hildred LaserAnika Cherry, MD  Prenatal Vit-Fe Fumarate-FA (PRENATAL VITAMIN)  27-0.8 MG TABS Take by mouth.    Historical Provider, MD    Allergies Patient has no known allergies.  Family History  Problem Relation Age of Onset  . Diabetes Mother   . Hypertension Mother   . Hypertension Paternal Grandmother   . Diabetes Maternal Aunt   . Diabetes Maternal Grandfather     Social History Social History  Substance Use Topics  . Smoking status: Former Games developermoker  . Smokeless tobacco: Never Used  . Alcohol use No    Review of Systems Constitutional: No fever/chills Eyes: No visual changes. ENT: No sore throat. Cardiovascular: Denies chest pain. Respiratory: Denies shortness of breath. Gastrointestinal: No abdominal pain.  No nausea, no vomiting.  No diarrhea.  No constipation. Genitourinary: Negative for dysuria.Positive for right pelvic pain Musculoskeletal: Negative for back pain. Skin: Negative for rash. Neurological: Negative for headaches, focal weakness or numbness.  10-point ROS otherwise negative.  ____________________________________________   PHYSICAL EXAM:  VITAL SIGNS: ED Triage Vitals  Enc Vitals Group     BP 10/12/16 2337 127/67     Pulse Rate 10/12/16 2337 95     Resp 10/12/16 2337 18     Temp 10/12/16 2337 97.8 F (36.6 C)     Temp Source 10/12/16 2337 Oral     SpO2 10/12/16 2337 99 %     Weight 10/12/16 2337 220 lb (99.8 kg)     Height 10/12/16 2337 5\' 3"  (1.6 m)     Head Circumference --      Peak Flow --      Pain Score 10/12/16  2338 4     Pain Loc --      Pain Edu? --      Excl. in GC? --     Constitutional: Alert and oriented. Well appearing and in no acute distress. Eyes: Conjunctivae are normal. PERRL. EOMI. Head: Atraumatic. Mouth/Throat: Mucous membranes are moist Neck: No stridor.   Cardiovascular: Normal rate, regular rhythm. Good peripheral circulation. Grossly normal heart sounds. Respiratory: Normal respiratory effort.  No retractions. Lungs CTAB. Gastrointestinal: Soft and nontender. No distention.    Musculoskeletal: No lower extremity tenderness nor edema. No gross deformities of extremities. Neurologic:  Normal speech and language. No gross focal neurologic deficits are appreciated.  Skin:  Skin is warm, dry and intact. No rash noted. Psychiatric: Mood and affect are normal. Speech and behavior are normal.  ____________________________________________   LABS (all labs ordered are listed, but only abnormal results are displayed)  Labs Reviewed  COMPREHENSIVE METABOLIC PANEL - Abnormal; Notable for the following:       Result Value   Glucose, Bld 234 (*)    Creatinine, Ser 1.09 (*)    All other components within normal limits  URINALYSIS, COMPLETE (UACMP) WITH MICROSCOPIC - Abnormal; Notable for the following:    Color, Urine YELLOW (*)    APPearance CLEAR (*)    Glucose, UA >=500 (*)    Ketones, ur 5 (*)    Squamous Epithelial / LPF 0-5 (*)    All other components within normal limits  LIPASE, BLOOD  CBC  HEMOGLOBIN A1C  POC URINE PREG, ED  POCT PREGNANCY, URINE    RADIOLOGY I, [place] BROWN, personally viewed and evaluated these images (plain radiographs) as part of my medical decision making, as well as reviewing the written report by the radiologist.  US Pelvis Complete  Result Date: 10/13/2016 CLINICAL DATA:  Right pelvic pain for 3 days. History of C-sections. Dermoid cyst removed from right ovary 2006. EXAM: TRANSABDOMINAL ULTRASOUND OF PELVIS TECHNIQUE: Transabdominal ultrasound examination of the pelvis was performed including evaluation of the uterus, ovaries, adnexal regions, and pelvic cul-de-sac. Patient refused transvaginal imaging. COMPARISON:  None. FINDINGS: Uterus Measurements: 14.2 x 4.7 x 5.3 cm. Uterus is retroverted. No fibroids or other mass visualized. Endometrium Thickness: 4.6 mm.  No focal abnormality visualized. Right ovary Measurements: 2.9 x 2 x 2 cm. Normal appearance/no adnexal mass. Left ovary Measurements: 4.1 x 2.5 x 2.6 cm. Normal  appearance/no adnexal mass. Other findings: Minimal free fluid in the pelvis. Flow is demonstrated in both ovaries on color flow Doppler imaging. IMPRESSION: Normal appearance of the uterus and ovaries. Electronically Signed   By: Burman Nieves M.D.   On: 10/13/2016 04:01     Procedures    INITIAL IMPRESSION / ASSESSMENT AND PLAN / ED COURSE  Pertinent labs & imaging results that were available during my care of the patient were reviewed by me and considered in my medical decision making (see chart for details).  Patient presenting with mild right pelvic pain no reproducible pain on exam the right lower quadrant or any abdominal pain on exam. Laboratory data unremarkable with the exception of hyperglycemia. I spoke with the patient regarding hyperglycemia and she informed me that she was told before that she had gestational diabetes. Review of the patient's chart revealed that she had a hemoglobin A1c of 7.1. We'll start metformin at this time and advised patient to follow up with primary care provider. Patient is advised to follow-up with OB/GYN for further evaluation of right pelvic pain  ____________________________________________  FINAL CLINICAL IMPRESSION(S) / ED DIAGNOSES  Final diagnoses:  Pelvic pain  Hyperglycemia     MEDICATIONS GIVEN DURING THIS VISIT:  Medications - No data to display   NEW OUTPATIENT MEDICATIONS STARTED DURING THIS VISIT:  New Prescriptions   No medications on file    Modified Medications   No medications on file    Discontinued Medications   No medications on file     Note:  This document was prepared using Dragon voice recognition software and may include unintentional dictation errors.    Darci Current, MD 10/13/16 [phone]

## 2016-10-13 NOTE — ED Notes (Addendum)
Patient was asking tech how long is this going to take and is this going to take forever? Explained that dr will be in as soon as he can.

## 2016-10-13 NOTE — ED Notes (Signed)
Patient feels like when she urinates it feels like it is blocked and that something wants to come out.

## 2016-10-13 NOTE — ED Notes (Signed)
ED Provider at bedside. 

## 2016-10-13 NOTE — ED Notes (Signed)
Going home with husband.

## 2016-10-14 LAB — HEMOGLOBIN A1C
HEMOGLOBIN A1C: 6.5 % — AB (ref 4.8–5.6)
MEAN PLASMA GLUCOSE: 140 mg/dL

## 2017-10-03 ENCOUNTER — Emergency Department
Admission: EM | Admit: 2017-10-03 | Discharge: 2017-10-03 | Disposition: A | Discharge status: Home / Self Care | Payer: Managed Care, Other (non HMO) | Attending: Emergency Medicine | Admitting: Emergency Medicine

## 2017-10-03 ENCOUNTER — Other Ambulatory Visit: Payer: Self-pay

## 2017-10-03 ENCOUNTER — Encounter: Payer: Self-pay | Admitting: Emergency Medicine

## 2017-10-03 DIAGNOSIS — R51 Headache: Secondary | ICD-10-CM | POA: Insufficient documentation

## 2017-10-03 DIAGNOSIS — E119 Type 2 diabetes mellitus without complications: Secondary | ICD-10-CM | POA: Insufficient documentation

## 2017-10-03 DIAGNOSIS — R519 Headache, unspecified: Secondary | ICD-10-CM

## 2017-10-03 DIAGNOSIS — Z7984 Long term (current) use of oral hypoglycemic drugs: Secondary | ICD-10-CM | POA: Insufficient documentation

## 2017-10-03 LAB — COMPREHENSIVE METABOLIC PANEL
ALBUMIN: 4 g/dL (ref 3.5–5.0)
ALT: 17 U/L (ref 14–54)
AST: 19 U/L (ref 15–41)
Alkaline Phosphatase: 80 U/L (ref 38–126)
Anion gap: 11 (ref 5–15)
BUN: 11 mg/dL (ref 6–20)
CHLORIDE: 100 mmol/L — AB (ref 101–111)
CO2: 24 mmol/L (ref 22–32)
CREATININE: 0.81 mg/dL (ref 0.44–1.00)
Calcium: 9.6 mg/dL (ref 8.9–10.3)
GFR calc Af Amer: >60 mL/min (ref >60)
GFR calc non Af Amer: >60 mL/min (ref >60)
Glucose, Bld: 146 mg/dL — ABNORMAL HIGH (ref 65–99)
Potassium: 4 mmol/L (ref 3.5–5.1)
SODIUM: 135 mmol/L (ref 135–145)
Total Bilirubin: 0.6 mg/dL (ref 0.3–1.2)
Total Protein: 8.2 g/dL — ABNORMAL HIGH (ref 6.5–8.1)

## 2017-10-03 LAB — CBC
HCT: 39.9 % (ref 35.0–47.0)
Hemoglobin: 13.6 g/dL (ref 12.0–16.0)
MCH: 30.1 pg (ref 26.0–34.0)
MCHC: 34 g/dL (ref 32.0–36.0)
MCV: 88.5 fL (ref 80.0–100.0)
PLATELETS: 322 10*3/uL (ref 150–440)
RBC: 4.51 MIL/uL (ref 3.80–5.20)
RDW: 12.8 % (ref 11.5–14.5)
WBC: 6.8 10*3/uL (ref 3.6–11.0)

## 2017-10-03 LAB — LIPASE, BLOOD: LIPASE: 27 U/L (ref 11–51)

## 2017-10-03 MED ORDER — BUTALBITAL-APAP-CAFFEINE 50-325-40 MG PO TABS
1.0000 | ORAL_TABLET | Freq: Once | ORAL | Status: AC
  Administered 2017-10-03: 1 via ORAL
  Filled 2017-10-03: qty 1

## 2017-10-03 NOTE — Discharge Instructions (Signed)
Please seek medical attention for any high fevers, chest pain, shortness of breath, change in behavior, persistent vomiting, bloody stool or any other new or concerning symptoms.  

## 2017-10-03 NOTE — ED Provider Notes (Signed)
[facility] Emergency Department Provider Note   ____________________________________________   I have reviewed the triage vital signs and the nursing notes.   HISTORY  Chief Complaint Headache and Nausea   History limited by: Not Limited   HPI Kari Tanner is a 32 y.o. female who presents to the emergency department today because of headache. Located on the right side behind her eye. Started at 11 am this morning. Started gradually and has gotten worse. She was at home working. Denies any unusual activity today. States she did eat her normal breakfast. Uses electrical heat in her house. No one else in the household with a headache. Denies any trauma to her head. Denies any fevers. Has had some associated blurry vision.   Per medical record review patient has a history of DM  Past Medical History:  Diagnosis Date  . Diabetes mellitus without complication (HCC)   . History of miscarriage, currently pregnant   . Obesity affecting pregnancy 2016  . Ovarian cyst     Patient Active Problem List   Diagnosis Date Noted  . S/P cesarean section 11/14/2015  . Gestational diabetes mellitus 10/21/2015  . Labor and delivery, indication for care 10/11/2015  . Polyhydramnios 09/28/2015  . Supervision of high risk pregnancy in third trimester 08/16/2015  . Diabetes mellitus during pregnancy, antepartum 07/04/2015  . History of preterm delivery, currently pregnant in third trimester 07/04/2015  . Obesity affecting pregnancy, antepartum 07/04/2015    Past Surgical History:  Procedure Laterality Date  . c sections    . CESAREAN SECTION    . CESAREAN SECTION N/A 11/14/2015   Procedure: REPEAT CESAREAN SECTION;  Surgeon: Hildred LaserAnika Cherry, MD;  Location: [facility] ORS;  Service: Obstetrics;  Laterality: N/A;  . OVARIAN CYST SURGERY Right 2007    Prior to Admission medications   Medication Sig Start Date End Date Taking? Authorizing Provider  metFORMIN (GLUCOPHAGE)  500 MG tablet Take 1 tablet (500 mg total) by mouth 2 (two) times daily with a meal. 10/13/16 10/13/17  Darci CurrentBrown, [place] N, MD  norethindrone (MICRONOR,CAMILA,ERRIN) 0.35 MG tablet Take 1 tablet (0.35 mg total) by mouth daily. 12/29/15   Hildred Laserherry, Anika, MD  Prenatal Vit-Fe Fumarate-FA (PRENATAL VITAMIN) 27-0.8 MG TABS Take by mouth.    [provider]    Allergies Patient has no known allergies.  Family History  Problem Relation Age of Onset  . Diabetes Mother   . Hypertension Mother   . Diabetes Maternal Aunt   . Diabetes Maternal Grandfather   . Hypertension Paternal Grandmother     Social History Social History   Tobacco Use  . Smoking status: Former Games developermoker  . Smokeless tobacco: Never Used  Substance Use Topics  . Alcohol use: No  . Drug use: No    Review of Systems Constitutional: No fever/chills Eyes: Positive for visual change ENT: No sore throat. Cardiovascular: Denies chest pain. Respiratory: Denies shortness of breath. Gastrointestinal: No abdominal pain.  No nausea, no vomiting.  No diarrhea.   Genitourinary: Negative for dysuria. Musculoskeletal: Negative for back pain. Skin: Negative for rash. Neurological: Positive for headache.  ____________________________________________   PHYSICAL EXAM:  VITAL SIGNS: ED Triage Vitals  Enc Vitals Group     BP 10/03/17 1404 124/79     Pulse Rate 10/03/17 1404 77     Resp 10/03/17 1404 20     Temp 10/03/17 1404 98.4 F (36.9 C)     Temp Source 10/03/17 1404 Oral     SpO2 10/03/17 1404 96 %  Weight 10/03/17 1406 235 lb (106.6 kg)     Height 10/03/17 1406 5\' 2"  (1.575 m)     Head Circumference --      Peak Flow --      Pain Score 10/03/17 1406 7    Constitutional: Alert and oriented. Well appearing and in no distress. Eyes: Conjunctivae are normal.  ENT   Head: Normocephalic and atraumatic.   Nose: No congestion/rhinnorhea.   Mouth/Throat: Mucous membranes are moist.   Neck: No  stridor. Hematological/Lymphatic/Immunilogical: No cervical lymphadenopathy. Cardiovascular: Normal rate, regular rhythm.  No murmurs, rubs, or gallops.  Respiratory: Normal respiratory effort without tachypnea nor retractions. Breath sounds are clear and equal bilaterally. No wheezes/rales/rhonchi. Gastrointestinal: Soft and non tender. No rebound. No guarding.  Genitourinary: Deferred Musculoskeletal: Normal range of motion in all extremities. No lower extremity edema. Neurologic:  Normal speech and language. No gross focal neurologic deficits are appreciated.  Skin:  Skin is warm, dry and intact. No rash noted. Psychiatric: Mood and affect are normal. Speech and behavior are normal. Patient exhibits appropriate insight and judgment.  ____________________________________________    LABS (pertinent positives/negatives)  CBC wnl Lipase 27 CMP k 4.0, cr 0.81  ____________________________________________   EKG  None  ____________________________________________    RADIOLOGY  None  ____________________________________________   PROCEDURES  Procedures  ____________________________________________   INITIAL IMPRESSION / ASSESSMENT AND PLAN / ED COURSE  Pertinent labs & imaging results that were available during my care of the patient were reviewed by me and considered in my medical decision making (see chart for details).  Presented to the emergency department today because of concerns for severe headache.  She has not tried any medication at home.  Patient without any other concerning findings.  Differential would include migraines, sinus headache, tension headache, cluster headache I think extremely less likely would be intracranial process such as bleed or sinus venous thrombosis.  Patient was given oral medication with good resolution of the pain.  This point I do think more likely tension type headache.  Discussed thoughts with the patient.  Will discharge  home.  ____________________________________________   FINAL CLINICAL IMPRESSION(S) / ED DIAGNOSES  Final diagnoses:  Bad headache     Note: This dictation was prepared with Dragon dictation. Any transcriptional errors that result from this process are unintentional     Phineas Semen, MD 10/03/17 1750

## 2017-10-03 NOTE — ED Notes (Signed)
PT verbalizes d/c instructions and follow up. PT in NAD at time of departure, VS stable, pt ambulatory

## 2017-10-24 IMAGING — US US MFM OB DETAIL+14 WK
1 series · 14 of 28 positions shown · non-contrast
Comparison: none

[Series 1: us mfm ob detail+14 wk · 0.22mm/px · 14 of 58 slices shown]
[im 3/58]
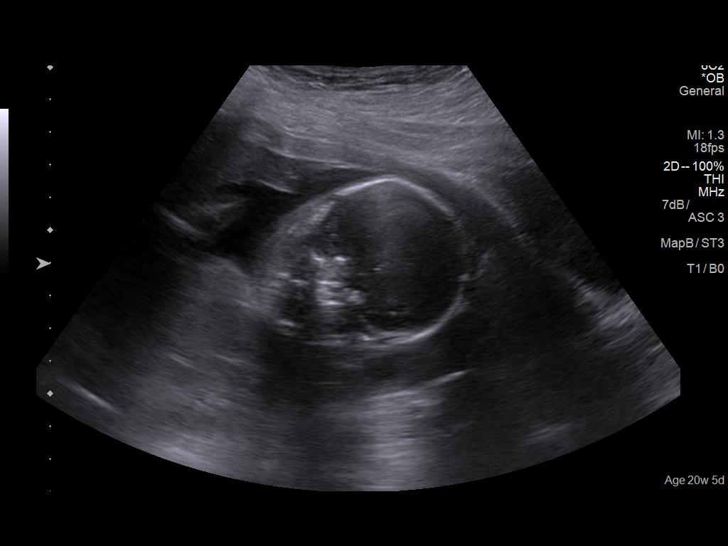
[im 7/58]
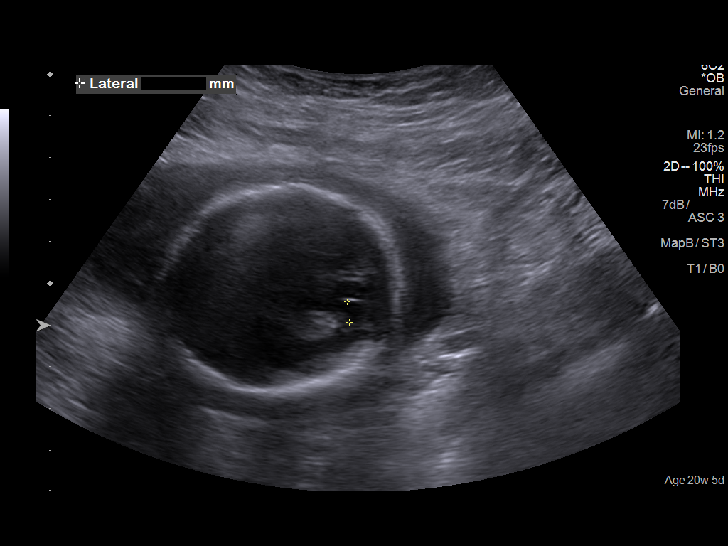
[im 11/58]
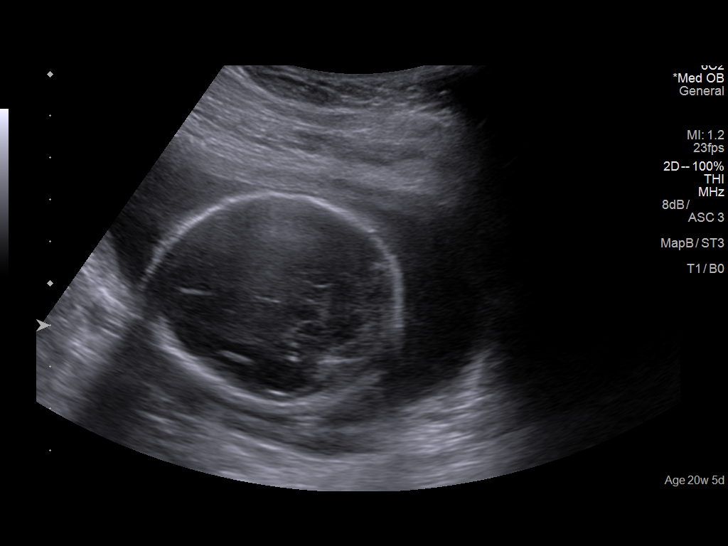
[im 15/58]
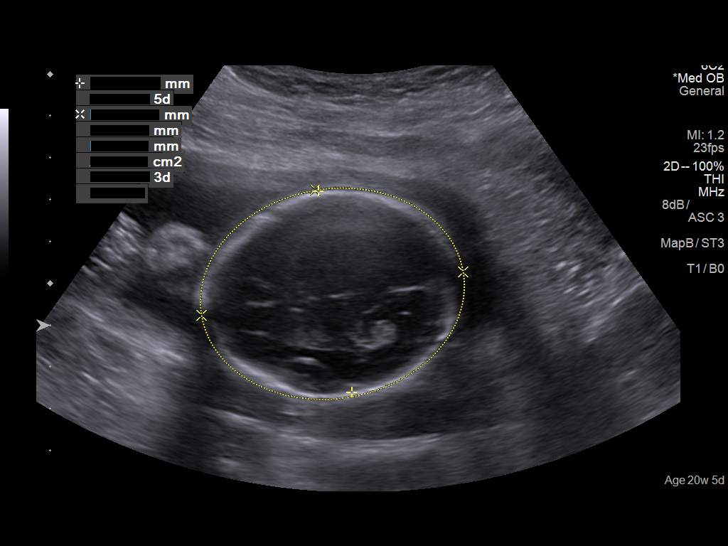
[im 20/58]
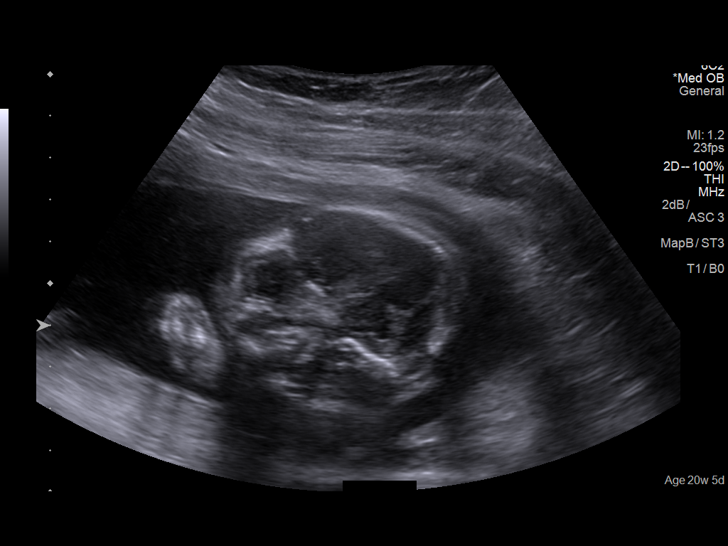
[im 24/58]
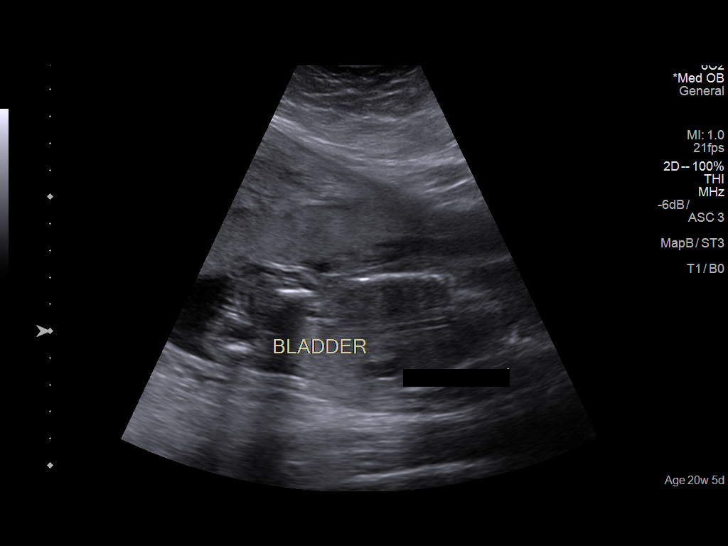
[im 28/58]
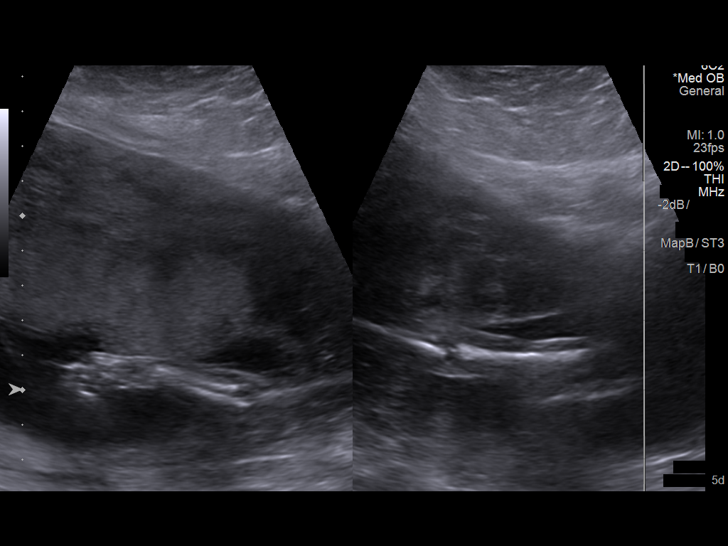
[im 32/58]
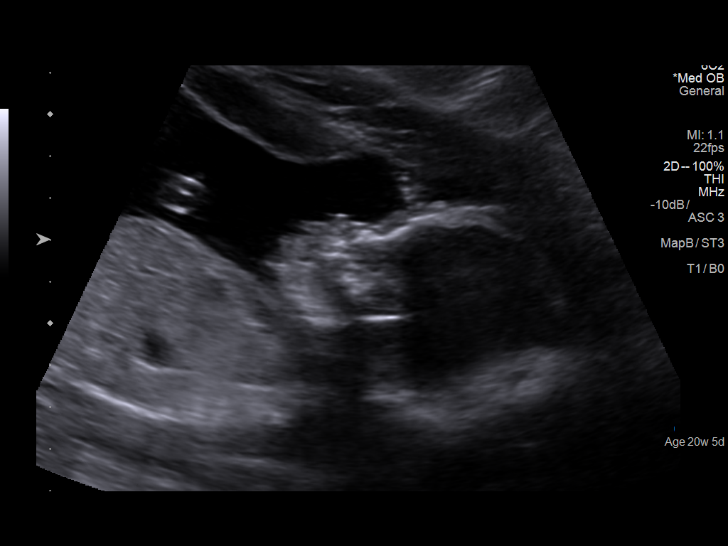
[im 36/58]
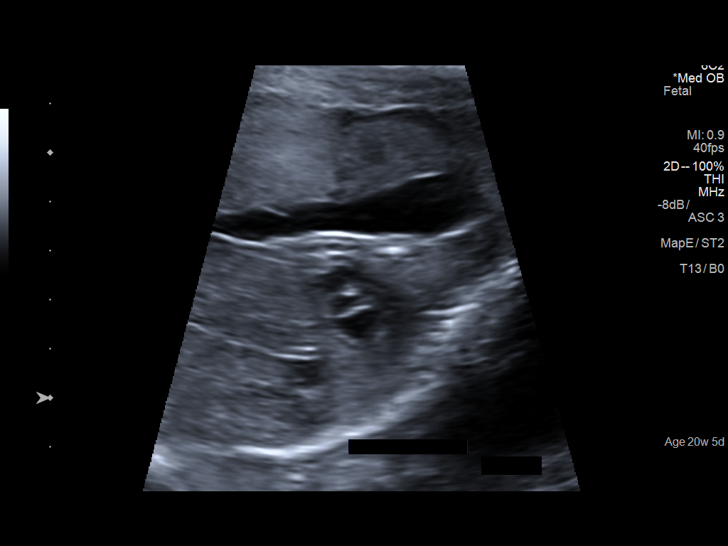
[im 41/58]
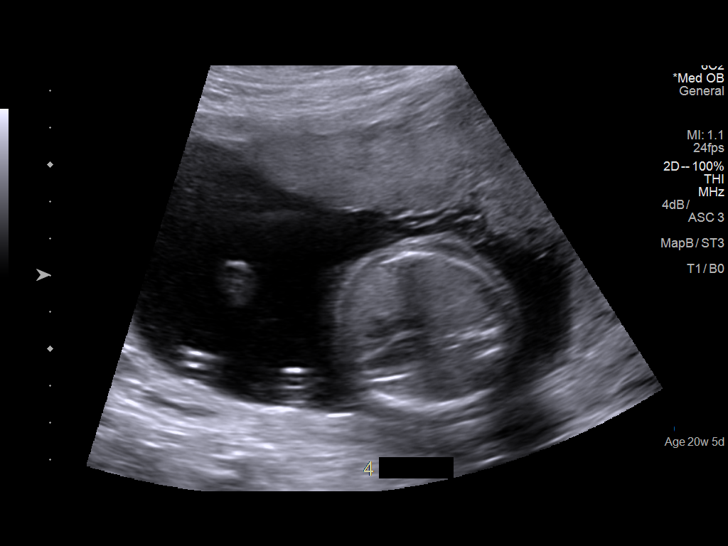
[im 45/58]
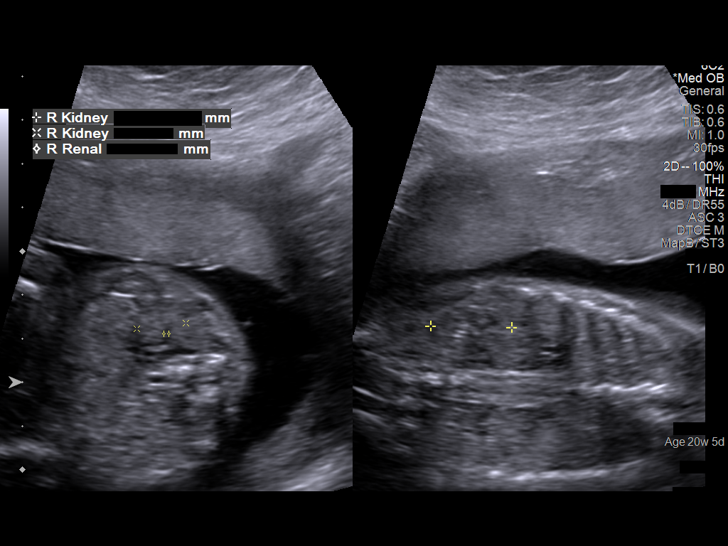
[im 49/58]
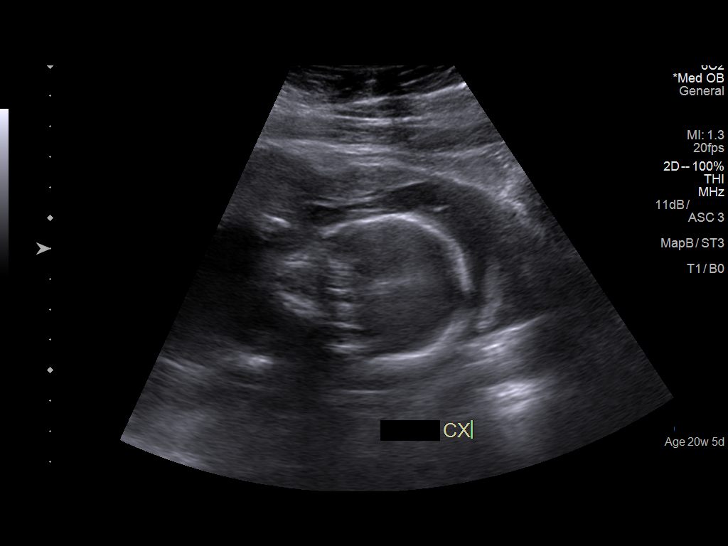
[im 53/58]
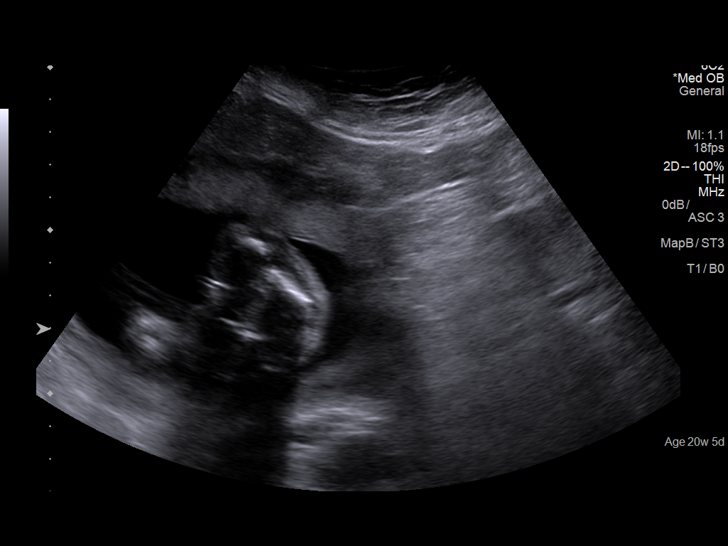
[im 58/58]
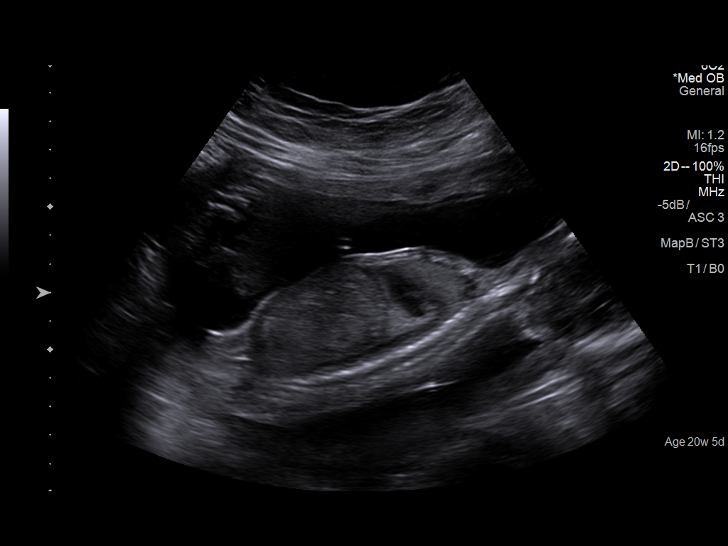

[14 of 28 positions shown; findings below may reference images not displayed]

Canned report from images found in remote index.

Refer to host system for actual result text.

## 2017-12-24 ENCOUNTER — Ambulatory Visit: Payer: Self-pay | Admitting: *Deleted

## 2017-12-24 NOTE — Telephone Encounter (Signed)
Pt reports mild rash, onset 2-3 days ago. Circular, dime size, one on left breast, 2 on abdomen. Red, slightly raised, no open areas. Pt states has had ringworm in past, "Not like that."  Denies itching, not painful. No fever or SOB.  Also reports headache since January which pt has appt regarding with C. Nche tomorrow.States takes Aleve for headaches which helps but "They come back."  Care advise given,instructed pt to call back if rash spreads,blisters,  any fever, increased headache.  Reason for Disposition . [1] Pimples (localized) AND Daffy.Glade[2 ] NO improvement after using Care Advice  Answer Assessment - Initial Assessment Questions 1. APPEARANCE of RASH: "Describe the rash."      Circular, dime sized, slightly raised 2. LOCATION: "Where is the rash located?"      1 left breast and 2-3 on abdomen 3. NUMBER: "How many spots are there?"      3 total 4. SIZE: "How big are the spots?" (Inches, centimeters or compare to size of a coin)      Dime shape 5. ONSET: "When did the rash start?"      2-3 days ago 6. ITCHING: "Does the rash itch?" If so, ask: "How bad is the itch?"  (Scale 1-10; or mild, moderate, severe)     No 7. PAIN: "Does the rash hurt?" If so, ask: "How bad is the pain?"  (Scale 1-10; or mild, moderate, severe)     No 8. OTHER SYMPTOMS: "Do you have any other symptoms?" (e.g., fever)     Headache since January; takes aleve, helps but it comes back 9. PREGNANCY: "Is there any chance you are pregnant?" "When was your last menstrual period?"    No  Protocols used: RASH OR REDNESS - LOCALIZED-A-AH

## 2017-12-25 ENCOUNTER — Ambulatory Visit: Payer: Managed Care, Other (non HMO) | Admitting: Nurse Practitioner

## 2017-12-25 ENCOUNTER — Other Ambulatory Visit: Payer: Self-pay

## 2017-12-25 ENCOUNTER — Encounter: Payer: Self-pay | Admitting: Emergency Medicine

## 2017-12-25 ENCOUNTER — Ambulatory Visit (INDEPENDENT_AMBULATORY_CARE_PROVIDER_SITE_OTHER): Payer: Managed Care, Other (non HMO)

## 2017-12-25 ENCOUNTER — Ambulatory Visit: Payer: Managed Care, Other (non HMO) | Admitting: Emergency Medicine

## 2017-12-25 VITALS — BP 126/66 | HR 105 | Temp 98.6°F | Resp 16 | Ht 64.0 in | Wt 240.2 lb

## 2017-12-25 DIAGNOSIS — Z8639 Personal history of other endocrine, nutritional and metabolic disease: Secondary | ICD-10-CM

## 2017-12-25 DIAGNOSIS — R51 Headache: Secondary | ICD-10-CM

## 2017-12-25 DIAGNOSIS — R21 Rash and other nonspecific skin eruption: Secondary | ICD-10-CM

## 2017-12-25 DIAGNOSIS — Z832 Family history of diseases of the blood and blood-forming organs and certain disorders involving the immune mechanism: Secondary | ICD-10-CM

## 2017-12-25 DIAGNOSIS — G8929 Other chronic pain: Secondary | ICD-10-CM

## 2017-12-25 MED ORDER — CLOTRIMAZOLE-BETAMETHASONE 1-0.05 % EX CREA: 1.0000 "application " | TOPICAL_CREAM | Freq: Two times a day (BID) | CUTANEOUS | 0 refills | Status: DC

## 2017-12-25 MED ORDER — RIZATRIPTAN BENZOATE 5 MG PO TABS: 5.0000 mg | ORAL_TABLET | ORAL | 0 refills | Status: AC | PRN

## 2017-12-25 NOTE — Progress Notes (Addendum)
Kari Tanner 32 y.o.   Chief Complaint  Patient presents with  . Headache    per patient since January 2019 with nausea  . Establish Care    per patient mother has Hx: Sarcodosis and patient has a rash chest/abd area x 5 days    HISTORY OF PRESENT ILLNESS: This is a 32 y.o. female with a history of chronic headaches since she was a teenager, never diagnosed, now complaining of daily headaches for the past 2-3 months.  Works from home and spends a lot of time on the computer.  Has a history of gestational diabetes.  Was on metformin at some point but no longer taking it.Was seen in the emergency room on 10/03/2017 for bad headache.  No imaging done.  Blood work revealed hyperglycemia. Also concerned because mother has a history of sarcoidosis.  HPI   Prior to Admission medications   Medication Sig Start Date End Date Taking? Authorizing Provider  metFORMIN (GLUCOPHAGE) 500 MG tablet Take 1 tablet (500 mg total) by mouth 2 (two) times daily with a meal. 10/13/16 10/13/17  Darci CurrentBrown, [place] N, MD  norethindrone (MICRONOR,CAMILA,ERRIN) 0.35 MG tablet Take 1 tablet (0.35 mg total) by mouth daily. Patient not taking: Reported on 12/25/2017 12/29/15   Hildred Laserherry, Anika, MD    No Known Allergies  Patient Active Problem List   Diagnosis Date Noted  . S/P cesarean section 11/14/2015  . Gestational diabetes mellitus 10/21/2015  . Labor and delivery, indication for care 10/11/2015  . Polyhydramnios 09/28/2015  . Supervision of high risk pregnancy in third trimester 08/16/2015  . Diabetes mellitus during pregnancy, antepartum 07/04/2015  . History of preterm delivery, currently pregnant in third trimester 07/04/2015  . Obesity affecting pregnancy, antepartum 07/04/2015    Past Medical History:  Diagnosis Date  . Diabetes mellitus without complication (HCC)   . History of miscarriage, currently pregnant   . Obesity affecting pregnancy 2016  . Ovarian cyst     Past Surgical History:    Procedure Laterality Date  . c sections    . CESAREAN SECTION    . CESAREAN SECTION N/A 11/14/2015   Procedure: REPEAT CESAREAN SECTION;  Surgeon: Hildred LaserAnika Cherry, MD;  Location: [facility] ORS;  Service: Obstetrics;  Laterality: N/A;  . OVARIAN CYST SURGERY Right 2007    Social History   Socioeconomic History  . Marital status: Married    Spouse name: Not on file  . Number of children: Not on file  . Years of education: Not on file  . Highest education level: Not on file  Occupational History  . Not on file  Social Needs  . Financial resource strain: Not on file  . Food insecurity:    Worry: Not on file    Inability: Not on file  . Transportation needs:    Medical: Not on file    Non-medical: Not on file  Tobacco Use  . Smoking status: Former Games developermoker  . Smokeless tobacco: Never Used  Substance and Sexual Activity  . Alcohol use: No  . Drug use: No  . Sexual activity: Yes    Birth control/protection: None  Lifestyle  . Physical activity:    Days per week: Not on file    Minutes per session: Not on file  . Stress: Not on file  Relationships  . Social connections:    Talks on phone: Not on file    Gets together: Not on file    Attends religious service: Not on file    Active member  of club or organization: Not on file    Attends meetings of clubs or organizations: Not on file    Relationship status: Not on file  . Intimate partner violence:    Fear of current or ex partner: Not on file    Emotionally abused: Not on file    Physically abused: Not on file    Forced sexual activity: Not on file  Other Topics Concern  . Not on file  Social History Narrative  . Not on file    Family History  Problem Relation Age of Onset  . Diabetes Mother   . Hypertension Mother   . Diabetes Maternal Aunt   . Diabetes Maternal Grandfather   . Hypertension Paternal Grandmother      Review of Systems  Constitutional: Negative.  Negative for chills and fever.  HENT: Negative.   Negative for sore throat.   Eyes: Negative.  Negative for blurred vision and double vision.  Respiratory: Negative.  Negative for cough and shortness of breath.   Cardiovascular: Negative.  Negative for chest pain and palpitations.  Gastrointestinal: Negative.  Negative for abdominal pain, diarrhea, nausea and vomiting.  Genitourinary: Negative.  Negative for dysuria and hematuria.  Musculoskeletal: Negative.  Negative for back pain, myalgias and neck pain.  Skin: Negative.  Negative for rash.  Neurological: Positive for headaches. Negative for dizziness, sensory change and focal weakness.  Endo/Heme/Allergies: Negative.   All other systems reviewed and are negative.   Vitals:   12/25/17 1212  BP: 126/66  Pulse: (!) 105  Resp: 16  Temp: 98.6 F (37 C)  SpO2: 98%    Physical Exam  Constitutional: She is oriented to person, place, and time. She appears well-developed and well-nourished.  HENT:  Head: Normocephalic and atraumatic.  Right Ear: External ear normal.  Left Ear: External ear normal.  Nose: Nose normal.  Mouth/Throat: Oropharynx is clear and moist.  Eyes: Pupils are equal, round, and reactive to light. Conjunctivae and EOM are normal.  Neck: Normal range of motion. Neck supple. No JVD present. No thyromegaly present.  Cardiovascular: Normal rate, regular rhythm and normal heart sounds.  Pulmonary/Chest: Effort normal and breath sounds normal.  Abdominal: Soft. Bowel sounds are normal. She exhibits no distension. There is no tenderness.  Musculoskeletal: Normal range of motion.  Lymphadenopathy:    She has no cervical adenopathy.  Neurological: She is alert and oriented to person, place, and time. No sensory deficit. She exhibits normal muscle tone.  Skin: Skin is warm and dry. Capillary refill takes less than 2 seconds. Rash (Several round lesions in the chest and abdomen looking like ringworm) noted.  Psychiatric: She has a normal mood and affect. Her behavior is  normal.  Vitals reviewed.   Dg Chest 2 View  Result Date: 12/25/2017 CLINICAL DATA:  Sarcoidosis EXAM: CHEST - 2 VIEW COMPARISON:  None. FINDINGS: Heart and mediastinal contours are within normal limits. No focal opacities or effusions. No acute bony abnormality. IMPRESSION: No active cardiopulmonary disease. Electronically Signed   By: Charlett Nose M.D.   On: 12/25/2017 13:14    A total of 30 minutes was spent in the room with the patient, greater than 50% of which was in counseling/coordination of care regarding differential diagnosis,management, medications, need for diagnostic imaging and possible referral to neurology.   ASSESSMENT & PLAN: Shamina was seen today for headache and establish care.  Diagnoses and all orders for this visit:  Chronic intractable headache, unspecified headache type -  CBC with Differential/Platelet -     Comprehensive metabolic panel -     CT Head Wo Contrast; Future -     rizatriptan (MAXALT) 5 MG tablet; Take 1 tablet (5 mg total) by mouth as needed for migraine. May repeat in 2 hours if needed  History of diabetes mellitus, type II -     CBC with Differential/Platelet -     Comprehensive metabolic panel -     Hemoglobin A1c  Family history of sarcoidosis -     DG Chest 2 View; Future  Rash and nonspecific skin eruption Comments: Suspected ringworm Orders: -     clotrimazole-betamethasone (LOTRISONE) cream; Apply 1 application topically 2 (two) times daily.    Patient Instructions       IF you received an x-ray today, you will receive an invoice from [facility]. Please contact [facility] at [phone] with questions or concerns regarding your invoice.   IF you received labwork today, you will receive an invoice from [facility]. Please contact [facility] at [phone] with questions or concerns regarding your invoice.   Our billing staff will not be able to assist you with questions regarding bills from these  companies.  You will be contacted with the lab results as soon as they are available. The fastest way to get your results is to activate your My Chart account. Instructions are located on the last page of this paperwork. If you have not heard from [place] regarding the results in 2 weeks, please contact this office.     General Headache Without Cause A headache is pain or discomfort felt around the head or neck area. There are many causes and types of headaches. In some cases, the cause may not be found. Follow these instructions at home: Managing pain  Take over-the-counter and prescription medicines only as told by your doctor.  Lie down in a dark, quiet room when you have a headache.  If directed, apply ice to the head and neck area: ? Put ice in a plastic bag. ? Place a towel between your skin and the bag. ? Leave the ice on for 20 minutes, 2-3 times per day.  Use a heating pad or hot shower to apply heat to the head and neck area as told by your doctor.  Keep lights dim if bright lights bother you or make your headaches worse. Eating and drinking  Eat meals on a regular schedule.  Lessen how much alcohol you drink.  Lessen how much caffeine you drink, or stop drinking caffeine. General instructions  Keep all follow-up visits as told by your doctor. This is important.  Keep a journal to find out if certain things bring on headaches. For example, write down: ? What you eat and drink. ? How much sleep you get. ? Any change to your diet or medicines.  Relax by getting a massage or doing other relaxing activities.  Lessen stress.  Sit up straight. Do not tighten (tense) your muscles.  Do not use tobacco products. This includes cigarettes, chewing tobacco, or e-cigarettes. If you need help quitting, ask your doctor.  Exercise regularly as told by your doctor.  Get enough sleep. This often means 7-9 hours of sleep. Contact a doctor if:  Your symptoms are not helped by  medicine.  You have a headache that feels different than the other headaches.  You feel sick to your stomach (nauseous) or you throw up (vomit).  You have a fever. Get help right away if:  Your headache becomes really bad.  You keep throwing up.  You have a stiff neck.  You have trouble seeing.  You have trouble speaking.  You have pain in the eye or ear.  Your muscles are weak or you lose muscle control.  You lose your balance or have trouble walking.  You feel like you will pass out (faint) or you pass out.  You have confusion. This information is not intended to replace advice given to you by your health care provider. Make sure you discuss any questions you have with your health care provider. Document Released: 05/29/2008 Document Revised: 01/26/2016 Document Reviewed: 12/13/2014 Elsevier Interactive Patient Education  2018 [employer].      Edwina Barth, MD [facility]

## 2017-12-25 NOTE — Patient Instructions (Addendum)
     IF you received an x-ray today, you will receive an invoice from [facility]. Please contact [facility] at [phone] with questions or concerns regarding your invoice.   IF you received labwork today, you will receive an invoice from [facility]. Please contact [facility] at [phone] with questions or concerns regarding your invoice.   Our billing staff will not be able to assist you with questions regarding bills from these companies.  You will be contacted with the lab results as soon as they are available. The fastest way to get your results is to activate your My Chart account. Instructions are located on the last page of this paperwork. If you have not heard from us regarding the results in 2 weeks, please contact this office.     General Headache Without Cause A headache is pain or discomfort felt around the head or neck area. There are many causes and types of headaches. In some cases, the cause may not be found. Follow these instructions at home: Managing pain  Take over-the-counter and prescription medicines only as told by your doctor.  Lie down in a dark, quiet room when you have a headache.  If directed, apply ice to the head and neck area: ? Put ice in a plastic bag. ? Place a towel between your skin and the bag. ? Leave the ice on for 20 minutes, 2-3 times per day.  Use a heating pad or hot shower to apply heat to the head and neck area as told by your doctor.  Keep lights dim if bright lights bother you or make your headaches worse. Eating and drinking  Eat meals on a regular schedule.  Lessen how much alcohol you drink.  Lessen how much caffeine you drink, or stop drinking caffeine. General instructions  Keep all follow-up visits as told by your doctor. This is important.  Keep a journal to find out if certain things bring on headaches. For example, write down: ? What you eat and drink. ? How much sleep you get. ? Any change to  your diet or medicines.  Relax by getting a massage or doing other relaxing activities.  Lessen stress.  Sit up straight. Do not tighten (tense) your muscles.  Do not use tobacco products. This includes cigarettes, chewing tobacco, or e-cigarettes. If you need help quitting, ask your doctor.  Exercise regularly as told by your doctor.  Get enough sleep. This often means 7-9 hours of sleep. Contact a doctor if:  Your symptoms are not helped by medicine.  You have a headache that feels different than the other headaches.  You feel sick to your stomach (nauseous) or you throw up (vomit).  You have a fever. Get help right away if:  Your headache becomes really bad.  You keep throwing up.  You have a stiff neck.  You have trouble seeing.  You have trouble speaking.  You have pain in the eye or ear.  Your muscles are weak or you lose muscle control.  You lose your balance or have trouble walking.  You feel like you will pass out (faint) or you pass out.  You have confusion. This information is not intended to replace advice given to you by your health care provider. Make sure you discuss any questions you have with your health care provider. Document Released: 05/29/2008 Document Revised: 01/26/2016 Document Reviewed: 12/13/2014 Elsevier Interactive Patient Education  2018 [employer] Inc[employer]  

## 2017-12-26 ENCOUNTER — Telehealth: Payer: Self-pay

## 2017-12-26 LAB — CBC WITH DIFFERENTIAL/PLATELET
BASOS ABS: 0 10*3/uL (ref 0.0–0.2)
Basos: 0 %
EOS (ABSOLUTE): 0.1 10*3/uL (ref 0.0–0.4)
Eos: 1 %
HEMATOCRIT: 40.8 % (ref 34.0–46.6)
Hemoglobin: 13.3 g/dL (ref 11.1–15.9)
Immature Grans (Abs): 0 10*3/uL (ref 0.0–0.1)
Immature Granulocytes: 0 %
Lymphocytes Absolute: 3 10*3/uL (ref 0.7–3.1)
Lymphs: 44 %
MCH: 29.4 pg (ref 26.6–33.0)
MCHC: 32.6 g/dL (ref 31.5–35.7)
MCV: 90 fL (ref 79–97)
MONOS ABS: 0.3 10*3/uL (ref 0.1–0.9)
Monocytes: 5 %
NEUTROS ABS: 3.4 10*3/uL (ref 1.4–7.0)
NEUTROS PCT: 50 %
Platelets: 330 10*3/uL (ref 150–379)
RBC: 4.53 x10E6/uL (ref 3.77–5.28)
RDW: 12.9 % (ref 12.3–15.4)
WBC: 6.7 10*3/uL (ref 3.4–10.8)

## 2017-12-26 LAB — COMPREHENSIVE METABOLIC PANEL
ALK PHOS: 95 IU/L (ref 39–117)
ALT: 16 IU/L (ref 0–32)
AST: 15 IU/L (ref 0–40)
Albumin/Globulin Ratio: 1.4 (ref 1.2–2.2)
Albumin: 4.2 g/dL (ref 3.5–5.5)
BILIRUBIN TOTAL: 0.3 mg/dL (ref 0.0–1.2)
BUN / CREAT RATIO: 12 (ref 9–23)
BUN: 10 mg/dL (ref 6–20)
CHLORIDE: 99 mmol/L (ref 96–106)
CO2: 23 mmol/L (ref 20–29)
CREATININE: 0.85 mg/dL (ref 0.57–1.00)
Calcium: 10 mg/dL (ref 8.7–10.2)
GFR calc Af Amer: 104 mL/min/{1.73_m2} (ref >59)
GFR calc non Af Amer: 90 mL/min/{1.73_m2} (ref >59)
GLOBULIN, TOTAL: 3.1 g/dL (ref 1.5–4.5)
GLUCOSE: 256 mg/dL — AB (ref 65–99)
Potassium: 4.5 mmol/L (ref 3.5–5.2)
SODIUM: 138 mmol/L (ref 134–144)
Total Protein: 7.3 g/dL (ref 6.0–8.5)

## 2017-12-26 LAB — HEMOGLOBIN A1C
ESTIMATED AVERAGE GLUCOSE: 180 mg/dL
HEMOGLOBIN A1C: 7.9 % — AB (ref 4.8–5.6)

## 2017-12-26 NOTE — Telephone Encounter (Signed)
Copied from CRM [id number]#[id number]. Topic: Inquiry >> Dec 26, 2017  8:10 AM Windy KalataMichael, Taylor L, NT wrote: Reason for CRM: patient is calling and states she was seen on 12/26/17 and the note for work said for her to return today. She states the nurse told her if she did not feel better when she woke up that she would extend the note. Patient would like Dr. Alvy BimlerSagardia nurse to call her.

## 2017-12-27 ENCOUNTER — Encounter: Payer: Self-pay | Admitting: Emergency Medicine

## 2017-12-27 ENCOUNTER — Ambulatory Visit: Payer: Managed Care, Other (non HMO) | Admitting: Emergency Medicine

## 2017-12-27 ENCOUNTER — Other Ambulatory Visit: Payer: Self-pay | Admitting: Emergency Medicine

## 2017-12-27 ENCOUNTER — Other Ambulatory Visit: Payer: Self-pay

## 2017-12-27 ENCOUNTER — Ambulatory Visit: Payer: Self-pay | Admitting: *Deleted

## 2017-12-27 VITALS — BP 98/60 | HR 92 | Temp 98.7°F | Resp 16 | Wt 241.4 lb

## 2017-12-27 DIAGNOSIS — I959 Hypotension, unspecified: Secondary | ICD-10-CM | POA: Diagnosis not present

## 2017-12-27 DIAGNOSIS — G8929 Other chronic pain: Secondary | ICD-10-CM | POA: Insufficient documentation

## 2017-12-27 DIAGNOSIS — R51 Headache: Secondary | ICD-10-CM

## 2017-12-27 DIAGNOSIS — R739 Hyperglycemia, unspecified: Secondary | ICD-10-CM

## 2017-12-27 DIAGNOSIS — E119 Type 2 diabetes mellitus without complications: Secondary | ICD-10-CM | POA: Diagnosis not present

## 2017-12-27 DIAGNOSIS — R42 Dizziness and giddiness: Secondary | ICD-10-CM | POA: Diagnosis not present

## 2017-12-27 DIAGNOSIS — T887XXA Unspecified adverse effect of drug or medicament, initial encounter: Secondary | ICD-10-CM

## 2017-12-27 MED ORDER — BUTALBITAL-APAP-CAFFEINE 50-325-40 MG PO TABS: 1.0000 | ORAL_TABLET | Freq: Four times a day (QID) | ORAL | 0 refills | Status: AC | PRN

## 2017-12-27 MED ORDER — METFORMIN HCL 500 MG PO TABS: 500.0000 mg | ORAL_TABLET | Freq: Two times a day (BID) | ORAL | 3 refills | Status: AC

## 2017-12-27 NOTE — Assessment & Plan Note (Signed)
Most likely secondary to medication (Maxalt).  Advised not to take it again.  Physical exam reveals lower than usual blood pressure, most likely medication related.  No clinical signs or symptoms of fluid loss or sepsis.  Blood reveals patient is a diabetic.  Started on metformin.  Fioricet as needed for headaches.  Will follow-up here after CT of the brain is done.

## 2017-12-27 NOTE — Patient Instructions (Addendum)
.  Stop Maxalt.  Push fluids.  Take Fioricet as needed.  Start metformin as prescribed. Diabetes Mellitus and Nutrition When you have diabetes (diabetes mellitus), it is very important to have healthy eating habits because your blood sugar (glucose) levels are greatly affected by what you eat and drink. Eating healthy foods in the appropriate amounts, at about the same times every day, can help you:  Control your blood glucose.  Lower your risk of heart disease.  Improve your blood pressure.  Reach or maintain a healthy weight.  Every person with diabetes is different, and each person has different needs for a meal plan. Your health care provider may recommend that you work with a diet and nutrition specialist (dietitian) to make a meal plan that is best for you. Your meal plan may vary depending on factors such as:  The calories you need.  The medicines you take.  Your weight.  Your blood glucose, blood pressure, and cholesterol levels.  Your activity level.  Other health conditions you have, such as heart or kidney disease.  How do carbohydrates affect me? Carbohydrates affect your blood glucose level more than any other type of food. Eating carbohydrates naturally increases the amount of glucose in your blood. Carbohydrate counting is a method for keeping track of how many carbohydrates you eat. Counting carbohydrates is important to keep your blood glucose at a healthy level, especially if you use insulin or take certain oral diabetes medicines. It is important to know how many carbohydrates you can safely have in each meal. This is different for every person. Your dietitian can help you calculate how many carbohydrates you should have at each meal and for snack. Foods that contain carbohydrates include:  Bread, cereal, rice, pasta, and crackers.  Potatoes and corn.  Peas, beans, and lentils.  Milk and yogurt.  Fruit and juice.  Desserts, such as cakes, cookies, ice  cream, and candy.  How does alcohol affect me? Alcohol can cause a sudden decrease in blood glucose (hypoglycemia), especially if you use insulin or take certain oral diabetes medicines. Hypoglycemia can be a life-threatening condition. Symptoms of hypoglycemia (sleepiness, dizziness, and confusion) are similar to symptoms of having too much alcohol. If your health care provider says that alcohol is safe for you, follow these guidelines:  Limit alcohol intake to no more than 1 drink per day for nonpregnant women and 2 drinks per day for men. One drink equals 12 oz of beer, 5 oz of wine, or 1 oz of hard liquor.  Do not drink on an empty stomach.  Keep yourself hydrated with water, diet soda, or unsweetened iced tea.  Keep in mind that regular soda, juice, and other mixers may contain a lot of sugar and must be counted as carbohydrates.  What are tips for following this plan? Reading food labels  Start by checking the serving size on the label. The amount of calories, carbohydrates, fats, and other nutrients listed on the label are based on one serving of the food. Many foods contain more than one serving per package.  Check the total grams (g) of carbohydrates in one serving. You can calculate the number of servings of carbohydrates in one serving by dividing the total carbohydrates by 15. For example, if a food has 30 g of total carbohydrates, it would be equal to 2 servings of carbohydrates.  Check the number of grams (g) of saturated and trans fats in one serving. Choose foods that have low or no amount  of these fats.  Check the number of milligrams (mg) of sodium in one serving. Most people should limit total sodium intake to less than 2,300 mg per day.  Always check the nutrition information of foods labeled as "low-fat" or "nonfat". These foods may be higher in added sugar or refined carbohydrates and should be avoided.  Talk to your dietitian to identify your daily goals for  nutrients listed on the label. Shopping  Avoid buying canned, premade, or processed foods. These foods tend to be high in fat, sodium, and added sugar.  Shop around the outside edge of the grocery store. This includes fresh fruits and vegetables, bulk grains, fresh meats, and fresh dairy. Cooking  Use low-heat cooking methods, such as baking, instead of high-heat cooking methods like deep frying.  Cook using healthy oils, such as olive, canola, or sunflower oil.  Avoid cooking with butter, cream, or high-fat meats. Meal planning  Eat meals and snacks regularly, preferably at the same times every day. Avoid going long periods of time without eating.  Eat foods high in fiber, such as fresh fruits, vegetables, beans, and whole grains. Talk to your dietitian about how many servings of carbohydrates you can eat at each meal.  Eat 4-6 ounces of lean protein each day, such as lean meat, chicken, fish, eggs, or tofu. 1 ounce is equal to 1 ounce of meat, chicken, or fish, 1 egg, or 1/4 cup of tofu.  Eat some foods each day that contain healthy fats, such as avocado, nuts, seeds, and fish. Lifestyle   Check your blood glucose regularly.  Exercise at least 30 minutes 5 or more days each week, or as told by your health care provider.  Take medicines as told by your health care provider.  Do not use any products that contain nicotine or tobacco, such as cigarettes and e-cigarettes. If you need help quitting, ask your health care provider.  Work with a Veterinary surgeon or diabetes educator to identify strategies to manage stress and any emotional and social challenges. What are some questions to ask my health care provider?  Do I need to meet with a diabetes educator?  Do I need to meet with a dietitian?  What number can I call if I have questions?  When are the best times to check my blood glucose? Where to find more information:  American Diabetes Association:  diabetes.org/food-and-fitness/food  Academy of Nutrition and Dietetics: https://www.vargas.com/  [employer] of Diabetes and Digestive and Kidney Diseases (NIH): FindJewelers.cz Summary  A healthy meal plan will help you control your blood glucose and maintain a healthy lifestyle.  Working with a diet and nutrition specialist (dietitian) can help you make a meal plan that is best for you.  Keep in mind that carbohydrates and alcohol have immediate effects on your blood glucose levels. It is important to count carbohydrates and to use alcohol carefully. This information is not intended to replace advice given to you by your health care provider. Make sure you discuss any questions you have with your health care provider. Document Released: 05/17/2005 Document Revised: 09/24/2016 Document Reviewed: 09/24/2016 Elsevier Interactive Patient Education  2018 [employer] Inc.  General Headache Without Cause A headache is pain or discomfort felt around the head or neck area. There are many causes and types of headaches. In some cases, the cause may not be found. Follow these instructions at home: Managing pain  Take over-the-counter and prescription medicines only as told by your doctor.  Lie down in a dark,  quiet room when you have a headache.  If directed, apply ice to the head and neck area: ? Put ice in a plastic bag. ? Place a towel between your skin and the bag. ? Leave the ice on for 20 minutes, 2-3 times per day.  Use a heating pad or hot shower to apply heat to the head and neck area as told by your doctor.  Keep lights dim if bright lights bother you or make your headaches worse. Eating and drinking  Eat meals on a regular schedule.  Lessen how much alcohol you drink.  Lessen how much caffeine you drink, or stop drinking caffeine. General instructions  Keep  all follow-up visits as told by your doctor. This is important.  Keep a journal to find out if certain things bring on headaches. For example, write down: ? What you eat and drink. ? How much sleep you get. ? Any change to your diet or medicines.  Relax by getting a massage or doing other relaxing activities.  Lessen stress.  Sit up straight. Do not tighten (tense) your muscles.  Do not use tobacco products. This includes cigarettes, chewing tobacco, or e-cigarettes. If you need help quitting, ask your doctor.  Exercise regularly as told by your doctor.  Get enough sleep. This often means 7-9 hours of sleep. Contact a doctor if:  Your symptoms are not helped by medicine.  You have a headache that feels different than the other headaches.  You feel sick to your stomach (nauseous) or you throw up (vomit).  You have a fever. Get help right away if:  Your headache becomes really bad.  You keep throwing up.  You have a stiff neck.  You have trouble seeing.  You have trouble speaking.  You have pain in the eye or ear.  Your muscles are weak or you lose muscle control.  You lose your balance or have trouble walking.  You feel like you will pass out (faint) or you pass out.  You have confusion. This information is not intended to replace advice given to you by your health care provider. Make sure you discuss any questions you have with your health care provider. Document Released: 05/29/2008 Document Revised: 01/26/2016 Document Reviewed: 12/13/2014 Elsevier Interactive Patient Education  2018 [employer]    IF you received an x-ray today, you will receive an invoice from [facility]. Please contact [facility] at [phone] with questions or concerns regarding your invoice.   IF you received labwork today, you will receive an invoice from [facility]. Please contact [facility] at [phone] with questions or concerns regarding your invoice.   Our  billing staff will not be able to assist you with questions regarding bills from these companies.  You will be contacted with the lab results as soon as they are available. The fastest way to get your results is to activate your My Chart account. Instructions are located on the last page of this paperwork. If you have not heard from us regarding the results in 2 weeks, please contact this office.

## 2017-12-27 NOTE — Telephone Encounter (Signed)
Patient phoned in with dizziness, no vertigo. She saw Dr. Alvy BimlerSagardia on Wednesday for a migraine and started Maxalt. The 1st morning after taking the medication the night before she started feeling dizzy (woozy). She is taking one dose a day due to her 2 yr. Old at home. Migraine has eased but continues. Home care advice given. Scheduled appointment for today.  Reason for Disposition . Taking a medicine that could cause dizziness (e.g., blood pressure medications, diuretics)  Answer Assessment - Initial Assessment Questions 1. DESCRIPTION: "Describe your dizziness."     woozy 2. LIGHTHEADED: "Do you feel lightheaded?" (e.g., somewhat faint, woozy, weak upon standing)     Feels woozy sitting up, then standing and upon walking. Stops when sits back down. 3. VERTIGO: "Do you feel like either you or the room is spinning or tilting?" (i.e. vertigo)   no 4. SEVERITY: "How bad is it?"  "Do you feel like you are going to faint?" "Can you stand and walk?"   - MILD - walking normally   - MODERATE - interferes with normal activities (e.g., work, school)    - SEVERE - unable to stand, requires support to walk, feels like passing out now.     Moderate. She has missed work since Monday. 5. ONSET:  "When did the dizziness begin?"    Yesterday when she woke up. 6. AGGRAVATING FACTORS: "Does anything make it worse?" (e.g., standing, change in head position)     Does not know. Has been lying down a lot this week. 7. HEART RATE: "Can you tell me your heart rate?" "How many beats in 15 seconds?"  (Note: not all patients can do this)      78 bpm 8. CAUSE: "What do you think is causing the dizziness?"      She was prescribed Maxalt 5 MG. Took one dose Wednesday night and one dose last night.  9. RECURRENT SYMPTOM: "Have you had dizziness before?" If so, ask: "When was the last time?" "What happened that time?"     no 10. OTHER SYMPTOMS: "Do you have any other symptoms?" (e.g., fever, chest pain, vomiting,  diarrhea, bleeding)       Nausea for the last 5 days. Night sweats since on Maxalt. 11. PREGNANCY: "Is there any chance you are pregnant?" "When was your last menstrual period?"     no  Protocols used: DIZZINESS [facility]- LIGHTHEADEDNESS-A-AH

## 2017-12-27 NOTE — Progress Notes (Signed)
BP Readings from Last 3 Encounters:  12/27/17 98/60  12/25/17 126/66  10/03/17 121/77    Kari Tanner 32 y.o.   Chief Complaint  Patient presents with  . Dizziness    start x 2 days  . Nausea    HISTORY OF PRESENT ILLNESS: This is a 32 y.o. female seen by me 2 days ago with intractable chronic headaches.  Suspected migraines.  Started on Maxalt.  Took medication Wednesday night.  Went to bed and slept well but woke up feeling lightheaded and dizzy.  Headache persisted.  Advil with some relief.  Headache went away during the day.  Was able to go out.  Headache came back later yesterday evening.  Took Maxalt again and again woke up today feeling dizzy lightheaded and nauseous.  Headache not too bad at present time.  No  other significant symptoms. Blood work showed hyperglycemia will increase hemoglobin A1c.  Metformin prescribed  today.  Has not started it yet. Had a normal CBC, normal electrolytes, normal kidney function, and normal chest x-ray.  HPI   Prior to Admission medications   Medication Sig Start Date End Date Taking? Authorizing Provider  clotrimazole-betamethasone (LOTRISONE) cream Apply 1 application topically 2 (two) times daily. 12/25/17  Yes Rodnisha Blomgren, Eilleen Kempf, MD  rizatriptan (MAXALT) 5 MG tablet Take 1 tablet (5 mg total) by mouth as needed for migraine. May repeat in 2 hours if needed 12/25/17  Yes Tully Mcinturff, Eilleen Kempf, MD  metFORMIN (GLUCOPHAGE) 500 MG tablet Take 1 tablet (500 mg total) by mouth 2 (two) times daily with a meal. 12/27/17   Earla Charlie, Eilleen Kempf, MD  norethindrone (MICRONOR,CAMILA,ERRIN) 0.35 MG tablet Take 1 tablet (0.35 mg total) by mouth daily. Patient not taking: Reported on 12/25/2017 12/29/15   Hildred Laser, MD    No Known Allergies  Patient Active Problem List   Diagnosis Date Noted  . S/P cesarean section 11/14/2015  . Gestational diabetes mellitus 10/21/2015  . Labor and delivery, indication for care 10/11/2015  .  Polyhydramnios 09/28/2015  . Supervision of high risk pregnancy in third trimester 08/16/2015  . Diabetes mellitus during pregnancy, antepartum 07/04/2015  . History of preterm delivery, currently pregnant in third trimester 07/04/2015  . Obesity affecting pregnancy, antepartum 07/04/2015    Past Medical History:  Diagnosis Date  . Allergy   . Diabetes mellitus without complication (HCC)   . History of miscarriage, currently pregnant   . Obesity affecting pregnancy 2016  . Ovarian cyst     Past Surgical History:  Procedure Laterality Date  . c sections    . CESAREAN SECTION    . CESAREAN SECTION N/A 11/14/2015   Procedure: REPEAT CESAREAN SECTION;  Surgeon: Hildred Laser, MD;  Location: [facility] ORS;  Service: Obstetrics;  Laterality: N/A;  . OVARIAN CYST SURGERY Right 2007    Social History   Socioeconomic History  . Marital status: Married    Spouse name: Not on file  . Number of children: Not on file  . Years of education: Not on file  . Highest education level: Not on file  Occupational History  . Not on file  Social Needs  . Financial resource strain: Not on file  . Food insecurity:    Worry: Not on file    Inability: Not on file  . Transportation needs:    Medical: Not on file    Non-medical: Not on file  Tobacco Use  . Smoking status: Former Games developer  . Smokeless tobacco: Never Used  Substance and  Sexual Activity  . Alcohol use: Yes    Comment: occasional  . Drug use: No  . Sexual activity: Yes    Birth control/protection: None  Lifestyle  . Physical activity:    Days per week: Not on file    Minutes per session: Not on file  . Stress: Not on file  Relationships  . Social connections:    Talks on phone: Not on file    Gets together: Not on file    Attends religious service: Not on file    Active member of club or organization: Not on file    Attends meetings of clubs or organizations: Not on file    Relationship status: Not on file  . Intimate partner  violence:    Fear of current or ex partner: Not on file    Emotionally abused: Not on file    Physically abused: Not on file    Forced sexual activity: Not on file  Other Topics Concern  . Not on file  Social History Narrative  . Not on file    Family History  Problem Relation Age of Onset  . Diabetes Mother   . Hypertension Mother   . Diabetes Maternal Aunt   . Diabetes Maternal Grandfather   . Heart disease Maternal Grandfather   . Hypertension Paternal Grandmother   . Heart disease Paternal Grandmother   . Hyperlipidemia Paternal Grandmother   . Mental illness Maternal Grandmother      Review of Systems  Constitutional: Negative.  Negative for chills and fever.  HENT: Negative.  Negative for sore throat.   Eyes: Negative.  Negative for discharge and redness.  Respiratory: Negative.  Negative for cough and shortness of breath.   Cardiovascular: Negative.  Negative for chest pain and palpitations.  Gastrointestinal: Positive for nausea. Negative for abdominal pain, diarrhea and vomiting.  Genitourinary: Negative.  Negative for dysuria and hematuria.  Musculoskeletal: Negative.  Negative for back pain, joint pain, myalgias and neck pain.  Skin: Negative for rash.  Neurological: Positive for dizziness and headaches. Negative for sensory change, focal weakness and weakness.  Endo/Heme/Allergies: Negative.   All other systems reviewed and are negative.   Vitals:   12/27/17 1348  BP: 98/60  Pulse: 92  Resp: 16  Temp: 98.7 F (37.1 C)  SpO2: 97%    Physical Exam  Constitutional: She is oriented to person, place, and time. She appears well-developed and well-nourished.  HENT:  Head: Normocephalic and atraumatic.  Nose: Nose normal.  Mouth/Throat: Oropharynx is clear and moist.  Eyes: Pupils are equal, round, and reactive to light. Conjunctivae and EOM are normal.  Neck: Normal range of motion. Neck supple.  Cardiovascular: Normal rate, regular rhythm and normal  heart sounds.  Pulmonary/Chest: Effort normal and breath sounds normal.  Abdominal: Soft. She exhibits no distension. There is no tenderness.  Musculoskeletal: Normal range of motion.  Lymphadenopathy:    She has no cervical adenopathy.  Neurological: She is alert and oriented to person, place, and time. No sensory deficit. She exhibits normal muscle tone. Coordination normal.  Skin: Skin is warm. Capillary refill takes less than 2 seconds. No rash noted.  Psychiatric: She has a normal mood and affect. Her behavior is normal.  Vitals reviewed.  Dizziness Most likely secondary to medication (Maxalt).  Advised not to take it again.  Physical exam reveals lower than usual blood pressure, most likely medication related.  No clinical signs or symptoms of fluid loss or sepsis.  Blood reveals patient  is a diabetic.  Started on metformin.  Fioricet as needed for headaches.  Will follow-up here after CT of the brain is done.  Moderate complexity decision making process.  ASSESSMENT & PLAN: Schelly was seen today for dizziness and nausea.  Diagnoses and all orders for this visit:  Dizziness  Chronic intractable headache, unspecified headache type -     butalbital-acetaminophen-caffeine (FIORICET, ESGIC) 50-325-40 MG tablet; Take 1 tablet by mouth every 6 (six) hours as needed for headache.  Medication side effects  Hypotension, unspecified hypotension type Comments: Most likely medication related  Type 2 diabetes mellitus without complication, without long-term current use of insulin (HCC)    Patient Instructions    .  Stop Maxalt.  Push fluids.  Take Fioricet as needed.  Start metformin as prescribed. Diabetes Mellitus and Nutrition When you have diabetes (diabetes mellitus), it is very important to have healthy eating habits because your blood sugar (glucose) levels are greatly affected by what you eat and drink. Eating healthy foods in the appropriate amounts, at about the same  times every day, can help you:  Control your blood glucose.  Lower your risk of heart disease.  Improve your blood pressure.  Reach or maintain a healthy weight.  Every person with diabetes is different, and each person has different needs for a meal plan. Your health care provider may recommend that you work with a diet and nutrition specialist (dietitian) to make a meal plan that is best for you. Your meal plan may vary depending on factors such as:  The calories you need.  The medicines you take.  Your weight.  Your blood glucose, blood pressure, and cholesterol levels.  Your activity level.  Other health conditions you have, such as heart or kidney disease.  How do carbohydrates affect me? Carbohydrates affect your blood glucose level more than any other type of food. Eating carbohydrates naturally increases the amount of glucose in your blood. Carbohydrate counting is a method for keeping track of how many carbohydrates you eat. Counting carbohydrates is important to keep your blood glucose at a healthy level, especially if you use insulin or take certain oral diabetes medicines. It is important to know how many carbohydrates you can safely have in each meal. This is different for every person. Your dietitian can help you calculate how many carbohydrates you should have at each meal and for snack. Foods that contain carbohydrates include:  Bread, cereal, rice, pasta, and crackers.  Potatoes and corn.  Peas, beans, and lentils.  Milk and yogurt.  Fruit and juice.  Desserts, such as cakes, cookies, ice cream, and candy.  How does alcohol affect me? Alcohol can cause a sudden decrease in blood glucose (hypoglycemia), especially if you use insulin or take certain oral diabetes medicines. Hypoglycemia can be a life-threatening condition. Symptoms of hypoglycemia (sleepiness, dizziness, and confusion) are similar to symptoms of having too much alcohol. If your health care  provider says that alcohol is safe for you, follow these guidelines:  Limit alcohol intake to no more than 1 drink per day for nonpregnant women and 2 drinks per day for men. One drink equals 12 oz of beer, 5 oz of wine, or 1 oz of hard liquor.  Do not drink on an empty stomach.  Keep yourself hydrated with water, diet soda, or unsweetened iced tea.  Keep in mind that regular soda, juice, and other mixers may contain a lot of sugar and must be counted as carbohydrates.  What are tips  for following this plan? Reading food labels  Start by checking the serving size on the label. The amount of calories, carbohydrates, fats, and other nutrients listed on the label are based on one serving of the food. Many foods contain more than one serving per package.  Check the total grams (g) of carbohydrates in one serving. You can calculate the number of servings of carbohydrates in one serving by dividing the total carbohydrates by 15. For example, if a food has 30 g of total carbohydrates, it would be equal to 2 servings of carbohydrates.  Check the number of grams (g) of saturated and trans fats in one serving. Choose foods that have low or no amount of these fats.  Check the number of milligrams (mg) of sodium in one serving. Most people should limit total sodium intake to less than 2,300 mg per day.  Always check the nutrition information of foods labeled as "low-fat" or "nonfat". These foods may be higher in added sugar or refined carbohydrates and should be avoided.  Talk to your dietitian to identify your daily goals for nutrients listed on the label. Shopping  Avoid buying canned, premade, or processed foods. These foods tend to be high in fat, sodium, and added sugar.  Shop around the outside edge of the grocery store. This includes fresh fruits and vegetables, bulk grains, fresh meats, and fresh dairy. Cooking  Use low-heat cooking methods, such as baking, instead of high-heat cooking  methods like deep frying.  Cook using healthy oils, such as olive, canola, or sunflower oil.  Avoid cooking with butter, cream, or high-fat meats. Meal planning  Eat meals and snacks regularly, preferably at the same times every day. Avoid going long periods of time without eating.  Eat foods high in fiber, such as fresh fruits, vegetables, beans, and whole grains. Talk to your dietitian about how many servings of carbohydrates you can eat at each meal.  Eat 4-6 ounces of lean protein each day, such as lean meat, chicken, fish, eggs, or tofu. 1 ounce is equal to 1 ounce of meat, chicken, or fish, 1 egg, or 1/4 cup of tofu.  Eat some foods each day that contain healthy fats, such as avocado, nuts, seeds, and fish. Lifestyle   Check your blood glucose regularly.  Exercise at least 30 minutes 5 or more days each week, or as told by your health care provider.  Take medicines as told by your health care provider.  Do not use any products that contain nicotine or tobacco, such as cigarettes and e-cigarettes. If you need help quitting, ask your health care provider.  Work with a Veterinary surgeon or diabetes educator to identify strategies to manage stress and any emotional and social challenges. What are some questions to ask my health care provider?  Do I need to meet with a diabetes educator?  Do I need to meet with a dietitian?  What number can I call if I have questions?  When are the best times to check my blood glucose? Where to find more information:  American Diabetes Association: diabetes.org/food-and-fitness/food  Academy of Nutrition and Dietetics: https://www.vargas.com/  [employer] of Diabetes and Digestive and Kidney Diseases (NIH): FindJewelers.cz Summary  A healthy meal plan will help you control your blood glucose and maintain a healthy  lifestyle.  Working with a diet and nutrition specialist (dietitian) can help you make a meal plan that is best for you.  Keep in mind that carbohydrates and alcohol have immediate effects on your  blood glucose levels. It is important to count carbohydrates and to use alcohol carefully. This information is not intended to replace advice given to you by your health care provider. Make sure you discuss any questions you have with your health care provider. Document Released: 05/17/2005 Document Revised: 09/24/2016 Document Reviewed: 09/24/2016 Elsevier Interactive Patient Education  2018 Elsevier Inc.  General Headache Without Cause A headache is pain or discomfort felt around the head or neck area. There are many causes and types of headaches. In some cases, the cause may not be found. Follow these instructions at home: Managing pain  Take over-the-counter and prescription medicines only as told by your doctor.  Lie down in a dark, quiet room when you have a headache.  If directed, apply ice to the head and neck area: ? Put ice in a plastic bag. ? Place a towel between your skin and the bag. ? Leave the ice on for 20 minutes, 2-3 times per day.  Use a heating pad or hot shower to apply heat to the head and neck area as told by your doctor.  Keep lights dim if bright lights bother you or make your headaches worse. Eating and drinking  Eat meals on a regular schedule.  Lessen how much alcohol you drink.  Lessen how much caffeine you drink, or stop drinking caffeine. General instructions  Keep all follow-up visits as told by your doctor. This is important.  Keep a journal to find out if certain things bring on headaches. For example, write down: ? What you eat and drink. ? How much sleep you get. ? Any change to your diet or medicines.  Relax by getting a massage or doing other relaxing activities.  Lessen stress.  Sit up straight. Do not tighten (tense) your muscles.  Do  not use tobacco products. This includes cigarettes, chewing tobacco, or e-cigarettes. If you need help quitting, ask your doctor.  Exercise regularly as told by your doctor.  Get enough sleep. This often means 7-9 hours of sleep. Contact a doctor if:  Your symptoms are not helped by medicine.  You have a headache that feels different than the other headaches.  You feel sick to your stomach (nauseous) or you throw up (vomit).  You have a fever. Get help right away if:  Your headache becomes really bad.  You keep throwing up.  You have a stiff neck.  You have trouble seeing.  You have trouble speaking.  You have pain in the eye or ear.  Your muscles are weak or you lose muscle control.  You lose your balance or have trouble walking.  You feel like you will pass out (faint) or you pass out.  You have confusion. This information is not intended to replace advice given to you by your health care provider. Make sure you discuss any questions you have with your health care provider. Document Released: 05/29/2008 Document Revised: 01/26/2016 Document Reviewed: 12/13/2014 Elsevier Interactive Patient Education  2018 [employer].    IF you received an x-ray today, you will receive an invoice from [facility]. Please contact [facility] at [phone] with questions or concerns regarding your invoice.   IF you received labwork today, you will receive an invoice from [facility]. Please contact [facility] at [phone] with questions or concerns regarding your invoice.   Our billing staff will not be able to assist you with questions regarding bills from these companies.  You will be contacted with the lab results as soon as  they are available. The fastest way to get your results is to activate your My Chart account. Instructions are located on the last page of this paperwork. If you have not heard from us regarding the results in 2 weeks, please contact this  office.         Edwina BarthMiguel Kayti Poss, MD [facility]

## 2017-12-28 ENCOUNTER — Telehealth: Payer: Self-pay | Admitting: Emergency Medicine

## 2017-12-28 NOTE — Telephone Encounter (Signed)
Patient is requesting a note to excuse her from work today. Provider, OK to write note?

## 2017-12-28 NOTE — Telephone Encounter (Signed)
Needs work note for Saturday. Was seen 12/27/17.  Best Number [phone]

## 2017-12-28 NOTE — Telephone Encounter (Signed)
OK to write note. Thanks.   

## 2017-12-28 NOTE — Telephone Encounter (Signed)
Pt. Returned call, wishes to be excused from work today 12/28/17 and be able to return after today. Her next work day is Monday.

## 2017-12-28 NOTE — Telephone Encounter (Signed)
Phone call to patient to ask what she would like her work note to say. Unable to reach, voicemail not set up.   If patient calls back, please ask if she would like letter to say.

## 2017-12-30 NOTE — Telephone Encounter (Signed)
Letter generated.  Phone call to patient, notified letter has been sent to Mychart. Patient appreciative.

## 2018-01-10 ENCOUNTER — Encounter: Payer: Self-pay | Admitting: Emergency Medicine

## 2018-01-10 NOTE — Telephone Encounter (Signed)
Please update dates. Thanks.

## 2018-01-14 ENCOUNTER — Ambulatory Visit: Payer: Managed Care, Other (non HMO) | Admitting: Primary Care

## 2018-01-14 ENCOUNTER — Ambulatory Visit: Payer: Self-pay | Admitting: Family Medicine

## 2018-02-14 IMAGING — US US OB FOLLOW-UP
1 series · 13 of 28 positions shown · non-contrast
Comparison: none

CLINICAL DATA: Gestational diabetes.

EXAM:
OBSTETRIC 14+ WK ULTRASOUND FOLLOW-UP

[Series 1: us ob follow-up · 0.25mm/px · 13 of 77 slices shown]
[im 3/77]
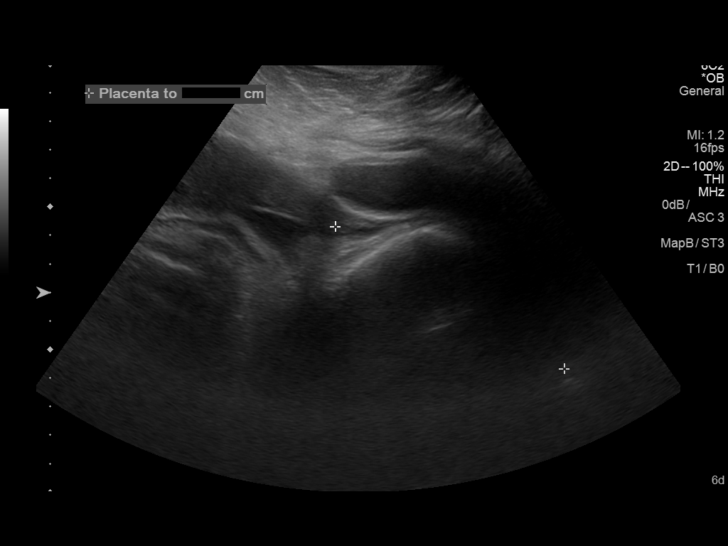
[im 9/77]
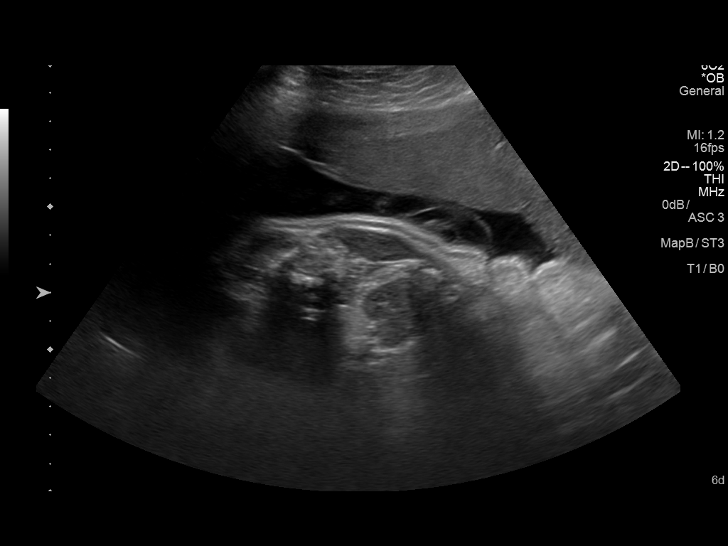
[im 15/77]
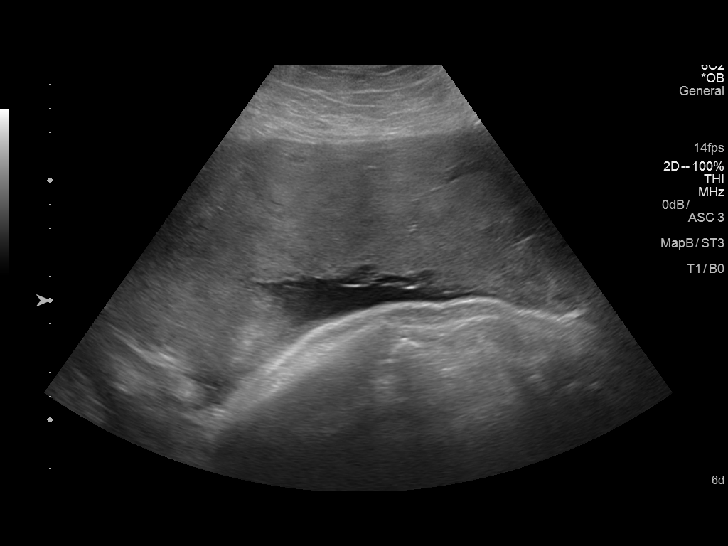
[im 20/77]
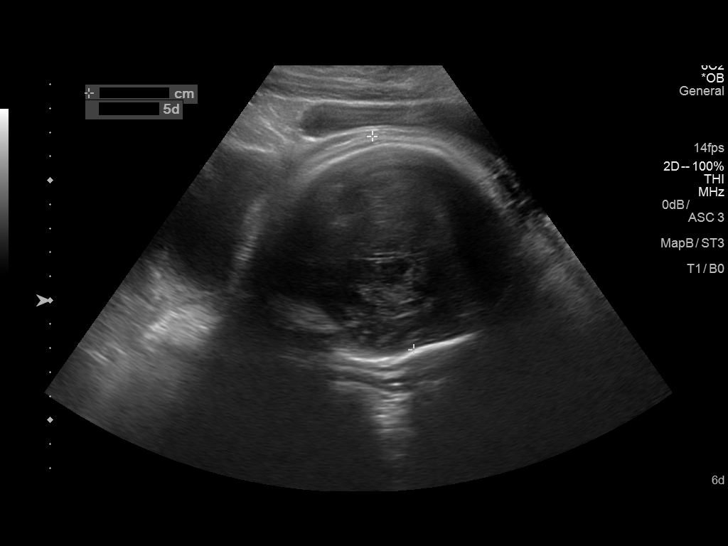
[im 26/77]
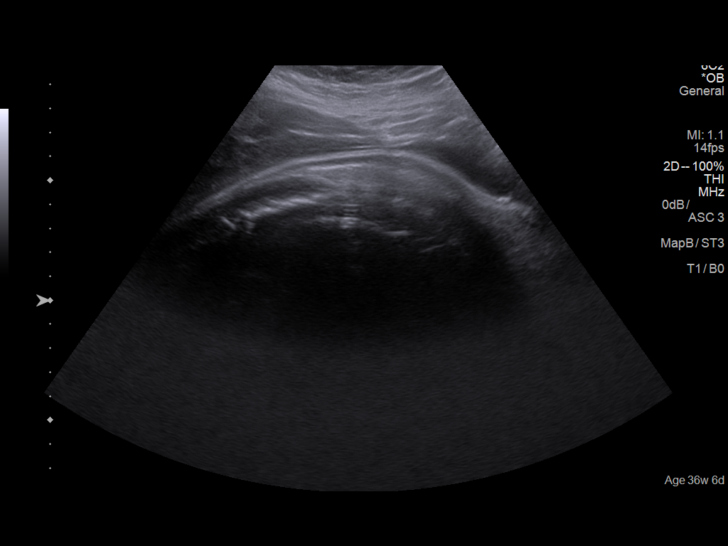
[im 31/77]
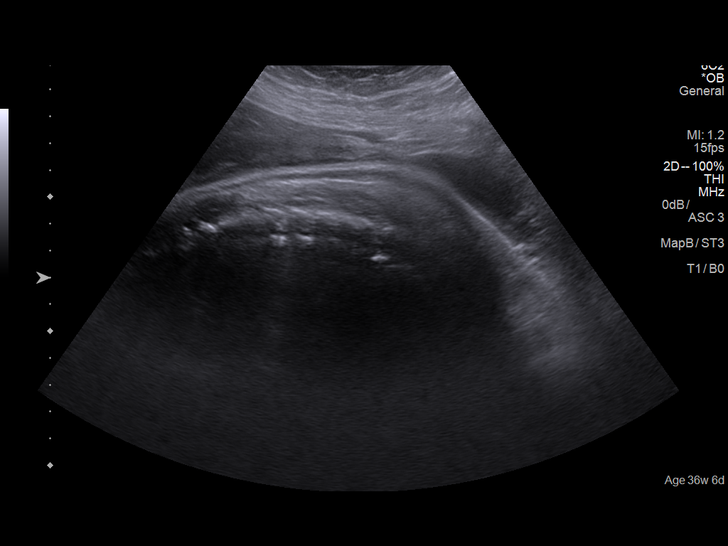
[im 40/77]
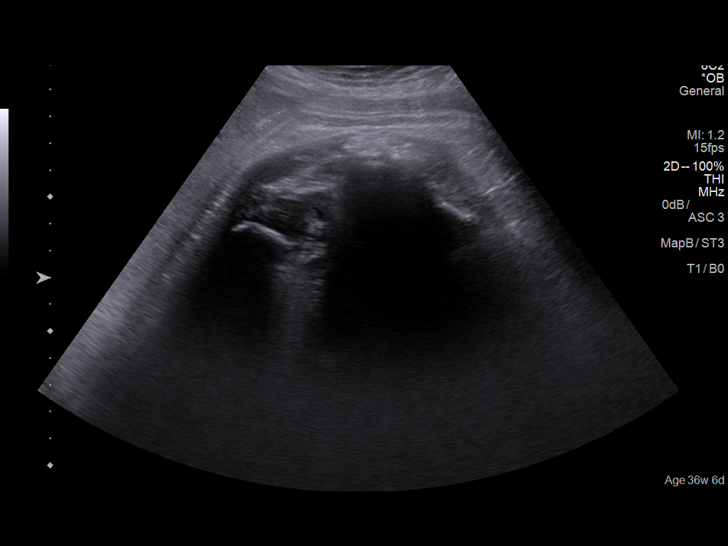
[im 46/77]
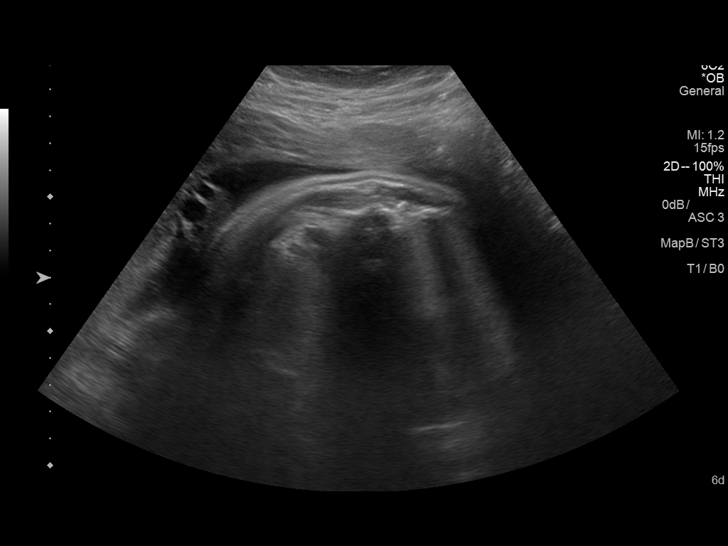
[im 51/77]
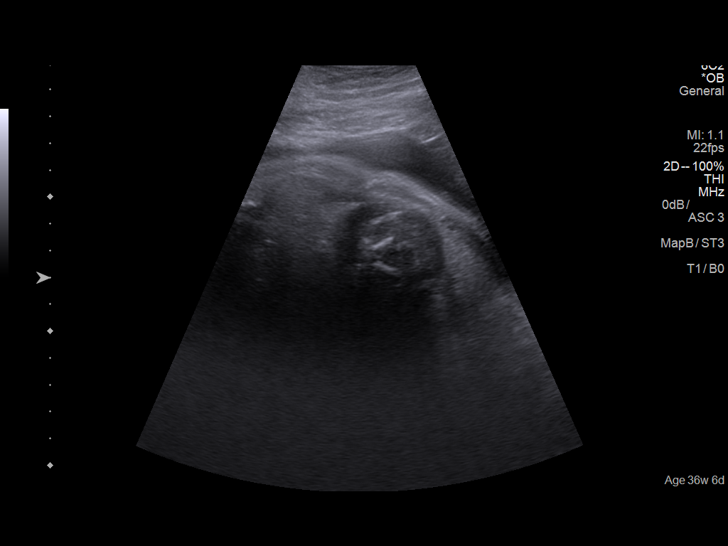
[im 57/77]
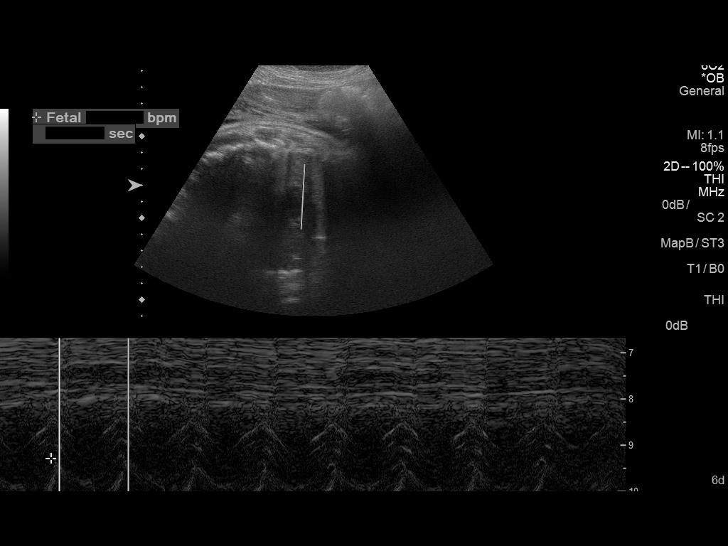
[im 62/77]
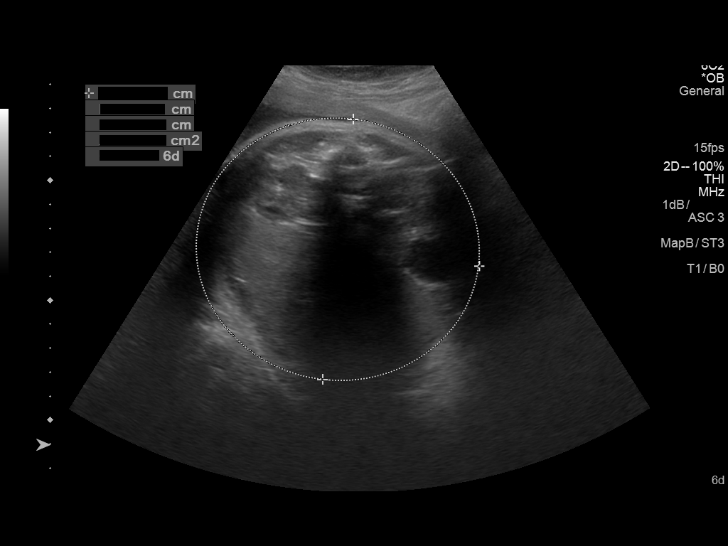
[im 68/77]
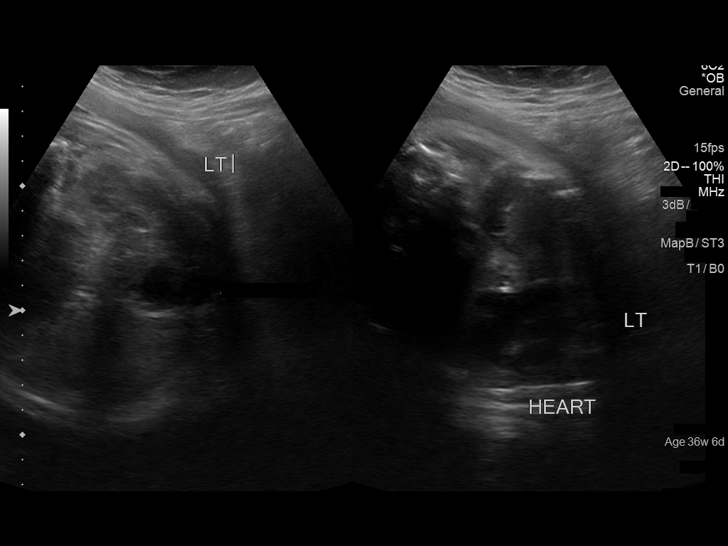
[im 74/77]
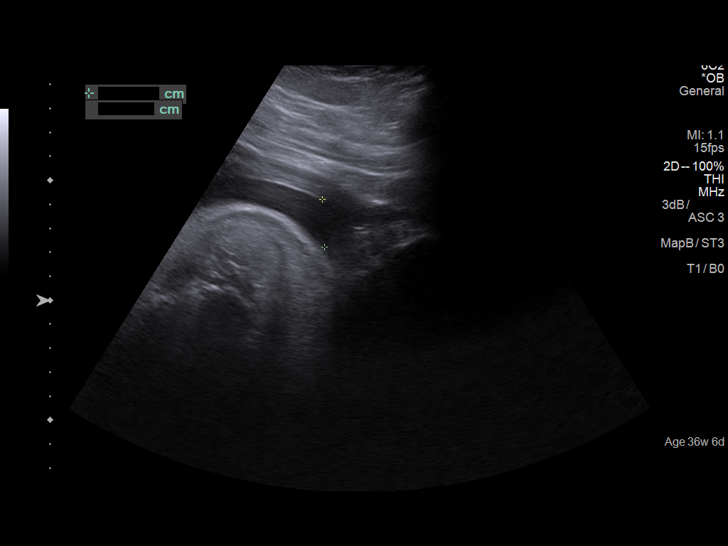

[13 of 28 positions shown; findings below may reference images not displayed]

FINDINGS: Number of Fetuses: 1

Heart Rate:  149 bpm

Movement: Present

Presentation: Cephalic

Previa: None

Placental Location: Anterior

Amniotic Fluid (Subjective): Normal

Amniotic Fluid (Objective):

Vertical pocket 5.7 cmcm

AFI 11.8 cm

FETAL BIOMETRY

BPD:  T.RRm 36w   5d

HC:    33.0cm  37[place]   [place]

AC:   36.9cm  48w   5d

FL:   7.2cm  36w   6d

Current Mean GA: 38w 2d          US EDC: 11/23/2015

Estimated Fetal Weight:  3681g 96%ile

FETAL ANATOMY

Lateral Ventricles: Previously visualized

Thalami/CSP: Visualized

Posterior Fossa:  Previously visualized

Nuchal Region: Previously visualize

Upper Lip: Previously visualized

Spine: Visualized

4 Chamber Heart on Left: Visualized

LVOT: Previously visualized

RVOT: Previously visualized

Stomach on Left: Visualized

3 Vessel Cord: Previously visualized

Cord Insertion site: Previously visualized

Kidneys: Visualized

Bladder: Visualized

Extremities: Previously visualized

Technically difficult due to: Limited exam due to late gestational
age and maternal body habitus.
IMPRESSION: Single viable intrauterine pregnancy in cephalic presentation. Mean
gestational age 38 weeks 2 days. Estimated fetal weight 5877 g (96
percentile).

## 2018-02-15 ENCOUNTER — Telehealth: Payer: Self-pay | Admitting: Emergency Medicine

## 2018-02-15 NOTE — Telephone Encounter (Signed)
[employer] has denied CT head w/o contrast.  Provider may do a peer to peer if they wish to appeal this decision

## 2020-04-12 ENCOUNTER — Telehealth: Payer: Self-pay | Admitting: Obstetrics and Gynecology

## 2020-04-12 NOTE — Telephone Encounter (Signed)
The pt stated that she seen here for her pregnancy and that she wanted to know what blood type she is. I told her I will send a message to the nurse. I work in the front and I don't have access to that information. The pt is requesting a call back. I told the pt to please allow 24-48 hours for a reply. The pt verbally understood. Please advise

## 2020-04-14 NOTE — Telephone Encounter (Signed)
Pt was called and informed that her blood type was O+.

## 2020-06-23 ENCOUNTER — Other Ambulatory Visit: Payer: Self-pay | Admitting: Family Medicine

## 2020-06-23 DIAGNOSIS — N926 Irregular menstruation, unspecified: Secondary | ICD-10-CM

## 2020-07-04 ENCOUNTER — Other Ambulatory Visit: Payer: Self-pay

## 2020-07-04 ENCOUNTER — Ambulatory Visit
Admission: RE | Admit: 2020-07-04 | Discharge: 2020-07-04 | Disposition: A | Discharge status: Home / Self Care | Payer: Medicaid Other | Source: Ambulatory Visit | Attending: Family Medicine | Admitting: Family Medicine

## 2020-07-04 DIAGNOSIS — N926 Irregular menstruation, unspecified: Secondary | ICD-10-CM | POA: Diagnosis not present

## 2020-09-28 ENCOUNTER — Telehealth: Payer: Self-pay

## 2020-09-28 NOTE — Telephone Encounter (Signed)
Pt called in and stated that she has been spotting for a week. The spotting is on and off, describes as red and then turned brownish. No pain. Most of the time occurs  when she wipes. The pt is requesting a call back. Please advise

## 2020-10-03 ENCOUNTER — Encounter: Payer: Self-pay | Admitting: *Deleted

## 2020-10-03 ENCOUNTER — Other Ambulatory Visit: Payer: Self-pay

## 2020-10-03 ENCOUNTER — Emergency Department
Admission: EM | Admit: 2020-10-03 | Discharge: 2020-10-03 | Disposition: A | Discharge status: Home / Self Care | Payer: Medicaid Other | Attending: Emergency Medicine | Admitting: Emergency Medicine

## 2020-10-03 ENCOUNTER — Emergency Department: Payer: Medicaid Other

## 2020-10-03 DIAGNOSIS — E1165 Type 2 diabetes mellitus with hyperglycemia: Secondary | ICD-10-CM | POA: Diagnosis not present

## 2020-10-03 DIAGNOSIS — O200 Threatened abortion: Secondary | ICD-10-CM | POA: Diagnosis not present

## 2020-10-03 DIAGNOSIS — Z87891 Personal history of nicotine dependence: Secondary | ICD-10-CM | POA: Diagnosis not present

## 2020-10-03 DIAGNOSIS — Z7984 Long term (current) use of oral hypoglycemic drugs: Secondary | ICD-10-CM | POA: Insufficient documentation

## 2020-10-03 DIAGNOSIS — R109 Unspecified abdominal pain: Secondary | ICD-10-CM | POA: Diagnosis not present

## 2020-10-03 DIAGNOSIS — O209 Hemorrhage in early pregnancy, unspecified: Secondary | ICD-10-CM

## 2020-10-03 DIAGNOSIS — N939 Abnormal uterine and vaginal bleeding, unspecified: Secondary | ICD-10-CM | POA: Diagnosis present

## 2020-10-03 LAB — COMPREHENSIVE METABOLIC PANEL
ALT: 18 U/L (ref 0–44)
AST: 18 U/L (ref 15–41)
Albumin: 3.7 g/dL (ref 3.5–5.0)
Alkaline Phosphatase: 75 U/L (ref 38–126)
Anion gap: 11 (ref 5–15)
BUN: 15 mg/dL (ref 6–20)
CO2: 23 mmol/L (ref 22–32)
Calcium: 9.9 mg/dL (ref 8.9–10.3)
Chloride: 100 mmol/L (ref 98–111)
Creatinine, Ser: 0.86 mg/dL (ref 0.44–1.00)
GFR, Estimated: >60 mL/min (ref >60)
Glucose, Bld: 345 mg/dL — ABNORMAL HIGH (ref 70–99)
Potassium: 4.1 mmol/L (ref 3.5–5.1)
Sodium: 134 mmol/L — ABNORMAL LOW (ref 135–145)
Total Bilirubin: 0.5 mg/dL (ref 0.3–1.2)
Total Protein: 7.6 g/dL (ref 6.5–8.1)

## 2020-10-03 LAB — CBC
HCT: 37.1 % (ref 36.0–46.0)
Hemoglobin: 12.7 g/dL (ref 12.0–15.0)
MCH: 29.9 pg (ref 26.0–34.0)
MCHC: 34.2 g/dL (ref 30.0–36.0)
MCV: 87.3 fL (ref 80.0–100.0)
Platelets: 320 10*3/uL (ref 150–400)
RBC: 4.25 MIL/uL (ref 3.87–5.11)
RDW: 12 % (ref 11.5–15.5)
WBC: 8.6 10*3/uL (ref 4.0–10.5)
nRBC: 0 % (ref 0.0–0.2)

## 2020-10-03 LAB — LIPASE, BLOOD: Lipase: 35 U/L (ref 11–51)

## 2020-10-03 LAB — URINALYSIS, COMPLETE (UACMP) WITH MICROSCOPIC
Bacteria, UA: NONE SEEN
Bilirubin Urine: NEGATIVE
Glucose, UA: >=500 mg/dL — AB
Hgb urine dipstick: NEGATIVE
Ketones, ur: NEGATIVE mg/dL
Leukocytes,Ua: NEGATIVE
Nitrite: NEGATIVE
Protein, ur: NEGATIVE mg/dL
Specific Gravity, Urine: 1.029 (ref 1.005–1.030)
pH: 5 (ref 5.0–8.0)

## 2020-10-03 LAB — HCG, QUANTITATIVE, PREGNANCY: hCG, Beta Chain, Quant, S: 2187 m[IU]/mL — ABNORMAL HIGH (ref <5)

## 2020-10-03 LAB — POC URINE PREG, ED: Preg Test, Ur: POSITIVE — AB

## 2020-10-03 NOTE — Telephone Encounter (Signed)
Pt called in this morning inquiring about message left Thursday- pt states that she has history of miscarriages- pt is scheduled for apt on 10-05-220. Pt has not reported any symptoms, is requesting a sooner apt and a call back, please advise.

## 2020-10-03 NOTE — Discharge Instructions (Addendum)
Follow-up with your OB/GYN on weds.  Return to the ER for weakness, fatigue or any other concerns Your sugar was elevated. Have OBGYN recheck this. Restart your medications   IMPRESSION: Probable early intrauterine gestational sac, but no yolk sac, fetal pole, or cardiac activity yet visualized. Recommend follow-up quantitative B-HCG levels and follow-up US in 14 days to assess viability. This recommendation follows SRU consensus guidelines: Diagnostic Criteria for Nonviable Pregnancy Early in the First Trimester. Malva Limes Med 2013; 657:8469-62.

## 2020-10-03 NOTE — Telephone Encounter (Signed)
Spoke to pt concerning her call to the office. Pt stated that she was still having brown blood and unsure what it is. Pt denies any pain. Pt has an appointment with [facility] 10/05/2020.

## 2020-10-03 NOTE — ED Provider Notes (Signed)
[facility] Emergency Department Provider Note  ____________________________________________   Event Date/Time   First MD Initiated Contact with Patient 10/03/20 2036     (approximate)  I have reviewed the triage vital signs and the nursing notes.   HISTORY  Chief Complaint Abdominal Pain    HPI Kari Tanner is a 35 y.o. female with diabetes, history of miscarriage who comes in for abdominal pain. Patient reports some vaginal spotting for the past few days. Took a pregnancy test 5 days ago that was positive. She had some intermittent lower abdominal cramping that is mild, constant, nothing makes better, nothing makes it worse. She also reports very mild vaginal spotting.          Past Medical History:  Diagnosis Date  . Allergy   . Diabetes mellitus without complication (HCC)   . History of miscarriage, currently pregnant   . Obesity affecting pregnancy 2016  . Ovarian cyst     Patient Active Problem List   Diagnosis Date Noted  . Chronic intractable headache 12/27/2017  . Type 2 diabetes mellitus without complication, without long-term current use of insulin (HCC) 12/27/2017  . Hypotension 12/27/2017  . Medication side effects 12/27/2017  . Dizziness 12/27/2017  . S/P cesarean section 11/14/2015  . Gestational diabetes mellitus 10/21/2015  . Labor and delivery, indication for care 10/11/2015  . Polyhydramnios 09/28/2015  . Supervision of high risk pregnancy in third trimester 08/16/2015  . Diabetes mellitus during pregnancy, antepartum 07/04/2015  . History of preterm delivery, currently pregnant in third trimester 07/04/2015  . Obesity affecting pregnancy, antepartum 07/04/2015    Past Surgical History:  Procedure Laterality Date  . c sections    . CESAREAN SECTION    . CESAREAN SECTION N/A 11/14/2015   Procedure: REPEAT CESAREAN SECTION;  Surgeon: Hildred Laser, MD;  Location: [facility] ORS;  Service: Obstetrics;  Laterality: N/A;   . OVARIAN CYST SURGERY Right 2007    Prior to Admission medications   Medication Sig Start Date End Date Taking? Authorizing Provider  clotrimazole-betamethasone (LOTRISONE) cream Apply 1 application topically 2 (two) times daily. 12/25/17   Georgina Quint, MD  metFORMIN (GLUCOPHAGE) 500 MG tablet Take 1 tablet (500 mg total) by mouth 2 (two) times daily with a meal. 12/27/17   Sagardia, Eilleen Kempf, MD  norethindrone (MICRONOR,CAMILA,ERRIN) 0.35 MG tablet Take 1 tablet (0.35 mg total) by mouth daily. Patient not taking: Reported on 12/25/2017 12/29/15   Hildred Laser, MD  rizatriptan (MAXALT) 5 MG tablet Take 1 tablet (5 mg total) by mouth as needed for migraine. May repeat in 2 hours if needed 12/25/17   Georgina Quint, MD    Allergies Patient has no known allergies.  Family History  Problem Relation Age of Onset  . Diabetes Mother   . Hypertension Mother   . Diabetes Maternal Aunt   . Diabetes Maternal Grandfather   . Heart disease Maternal Grandfather   . Hypertension Paternal Grandmother   . Heart disease Paternal Grandmother   . Hyperlipidemia Paternal Grandmother   . Mental illness Maternal Grandmother     Social History Social History   Tobacco Use  . Smoking status: Former Games developer  . Smokeless tobacco: Never Used  Substance Use Topics  . Alcohol use: Yes    Comment: occasional  . Drug use: No      Review of Systems Constitutional: No fever/chills Eyes: No visual changes. ENT: No sore throat. Cardiovascular: Denies chest pain. Respiratory: Denies shortness of breath. Gastrointestinal: Abdominal  cramping no nausea, no vomiting.  No diarrhea.  No constipation. Genitourinary: Negative for dysuria. Vaginal spotting Musculoskeletal: Negative for back pain. Skin: Negative for rash. Neurological: Negative for headaches, focal weakness or numbness. All other ROS negative ____________________________________________   PHYSICAL EXAM:  VITAL  SIGNS: ED Triage Vitals  Enc Vitals Group     BP 10/03/20 1914 109/67     Pulse Rate 10/03/20 1914 98     Resp 10/03/20 1914 18     Temp 10/03/20 1914 98.6 F (37 C)     Temp Source 10/03/20 1914 Oral     SpO2 10/03/20 1914 95 %     Weight 10/03/20 1911 220 lb (99.8 kg)     Height 10/03/20 1911 5\' 3"  (1.6 m)     Head Circumference --      Peak Flow --      Pain Score 10/03/20 1911 5     Pain Loc --      Pain Edu? --      Excl. in GC? --     Constitutional: Alert and oriented. Well appearing and in no acute distress. Eyes: Conjunctivae are normal. EOMI. Head: Atraumatic. Nose: No congestion/rhinnorhea. Mouth/Throat: Mucous membranes are moist.   Neck: No stridor. Trachea Midline. FROM Cardiovascular: Normal rate, regular rhythm. Grossly normal heart sounds.  Good peripheral circulation. Respiratory: Normal respiratory effort.  No retractions. Lungs CTAB. Gastrointestinal: Soft and nontender. No distention. No abdominal bruits.  Musculoskeletal: No lower extremity tenderness nor edema.  No joint effusions. Neurologic:  Normal speech and language. No gross focal neurologic deficits are appreciated.  Skin:  Skin is warm, dry and intact. No rash noted. Psychiatric: Mood and affect are normal. Speech and behavior are normal. GU: Deferred   ____________________________________________   LABS (all labs ordered are listed, but only abnormal results are displayed)  Labs Reviewed  COMPREHENSIVE METABOLIC PANEL - Abnormal; Notable for the following components:      Result Value   Sodium 134 (*)    Glucose, Bld 345 (*)    All other components within normal limits  URINALYSIS, COMPLETE (UACMP) WITH MICROSCOPIC - Abnormal; Notable for the following components:   Color, Urine STRAW (*)    APPearance CLEAR (*)    Glucose, UA >=500 (*)    All other components within normal limits  HCG, QUANTITATIVE, PREGNANCY - Abnormal; Notable for the following components:   hCG, Beta Chain,  Quant, S 2,187 (*)    All other components within normal limits  POC URINE PREG, ED - Abnormal; Notable for the following components:   Preg Test, Ur POSITIVE (*)    All other components within normal limits  LIPASE, BLOOD  CBC   ____________________________________________   RADIOLOGY   Official radiology report(s): 03-12-2001 OB LESS THAN 14 WEEKS WITH OB TRANSVAGINAL  Result Date: 10/03/2020 CLINICAL DATA:  Pregnant patient in first-trimester pregnancy with vaginal bleeding. Beta HCG 09/22/1985 EXAM: OBSTETRIC <14 WK 09/24/1985 AND TRANSVAGINAL OB US TECHNIQUE: Both transabdominal and transvaginal ultrasound examinations were performed for complete evaluation of the gestation as well as the maternal uterus, adnexal regions, and pelvic cul-de-sac. Transvaginal technique was performed to assess early pregnancy. COMPARISON:  None this pregnancy. FINDINGS: Intrauterine gestational sac: Single. Yolk sac:  Not Visualized. Embryo:  Not Visualized. MSD: 5.8 mm   5 w   2 d Subchorionic hemorrhage:  None visualized. Maternal uterus/adnexae: Probable early intrauterine gestational sac with mean sac diameter of 5.8 mm. No yolk sac or fetal pole yet visualized. No  subchorionic hemorrhage. Right ovary appears normal measuring 3.0 x 2.2 x 2.2 cm. Blood flow is noted. Cyst within the left ovary measuring 3.1 x 2.9 x 3 cm is likely a corpus luteum. Ovarian blood flow is seen. No adnexal mass or pelvic free fluid. IMPRESSION: Probable early intrauterine gestational sac, but no yolk sac, fetal pole, or cardiac activity yet visualized. Recommend follow-up quantitative B-HCG levels and follow-up US in 14 days to assess viability. This recommendation follows SRU consensus guidelines: Diagnostic Criteria for Nonviable Pregnancy Early in the First Trimester. Malva Limes Med 2013; 287:8676-72. Electronically Signed   By: Narda Rutherford M.D.   On: 10/03/2020 20:44     ____________________________________________   PROCEDURES  Procedure(s) performed (including Critical Care):  Procedures   ____________________________________________   INITIAL IMPRESSION / ASSESSMENT AND PLAN / ED COURSE  Johan Antonacci was evaluated in Emergency Department on 10/03/2020 for the symptoms described in the history of present illness. She was evaluated in the context of the global COVID-19 pandemic, which necessitated consideration that the patient might be at risk for infection with the SARS-CoV-2 virus that causes COVID-19. Institutional protocols and algorithms that pertain to the evaluation of patients at risk for COVID-19 are in a state of rapid change based on information released by regulatory bodies including the CDC and federal and state organizations. These policies and algorithms were followed during the patient's care in the ED.     Patient is a 35 year old who is pregnant who comes in with vaginal bleeding and some cramping.  Most of her pain is in her right flank.  It is minimal in nature.  Her abdomen is soft and nontender.  I have low suspicion for appendicitis or ovarian torsion.  I suspect it is more likely threatened miscarriage.  We will get ultrasound to rule out ectopic.  Discussed with patient.  She is O+.  Confirm this and her blood work back in 2017 when she had her last baby.  Does not need RhoGam  Patient sugars elevated at 345--> discussed with patient that her glucose was elevated.  Stated that she has not been taking her Metformin.  Discussed that she needs to restart that discussed with her OB/GYN team as he may need to start her on insulin.  UA without evidence of UTI  hCG is elevated at 2187 and ultrasound shows gestational sac but no yolk sac.  Patient already has follow-up on Wednesday with her OB/GYN.  Declines concerns for STDs or vaginal discharge so have deferred  pelvic   ____________________________________________   FINAL CLINICAL IMPRESSION(S) / ED DIAGNOSES   Final diagnoses:  Threatened miscarriage in early pregnancy  Hyperglycemia due to diabetes mellitus (HCC)      MEDICATIONS GIVEN DURING THIS VISIT:  Medications - No data to display   ED Discharge Orders    None       Note:  This document was prepared using Dragon voice recognition software and may include unintentional dictation errors.   Concha Se, MD 10/03/20 2120

## 2020-10-03 NOTE — ED Triage Notes (Signed)
Pt ambulatory to triage. Pt has abd cramping and vag spotting.  Pt had a positive home pregnancy test.   Sx began yesterday.  Pt alert  Speech clear.

## 2020-10-05 ENCOUNTER — Ambulatory Visit (INDEPENDENT_AMBULATORY_CARE_PROVIDER_SITE_OTHER): Payer: Medicaid Other | Admitting: Obstetrics and Gynecology

## 2020-10-05 ENCOUNTER — Encounter: Payer: Self-pay | Admitting: Obstetrics and Gynecology

## 2020-10-05 ENCOUNTER — Other Ambulatory Visit: Payer: Self-pay

## 2020-10-05 VITALS — BP 112/70 | HR 105 | Ht 63.0 in | Wt 222.8 lb

## 2020-10-05 DIAGNOSIS — O09521 Supervision of elderly multigravida, first trimester: Secondary | ICD-10-CM | POA: Diagnosis not present

## 2020-10-05 DIAGNOSIS — O200 Threatened abortion: Secondary | ICD-10-CM | POA: Diagnosis not present

## 2020-10-05 DIAGNOSIS — O24311 Unspecified pre-existing diabetes mellitus in pregnancy, first trimester: Secondary | ICD-10-CM | POA: Diagnosis not present

## 2020-10-05 LAB — POCT URINE PREGNANCY: Preg Test, Ur: POSITIVE — AB

## 2020-10-05 NOTE — Patient Instructions (Signed)
Threatened Miscarriage A threatened miscarriage is when a woman bleeds in her vagina during the first 20 weeks of pregnancy but the pregnancy has not ended. The doctor will do tests to make sure you are still pregnant. This condition does not mean your pregnancy will end, but it does increase the risk that it will end (miscarriage). What are the causes? Normally, the cause of this condition is not known. What increases the risk? These things may make a pregnant woman more likely to lose a pregnancy: Certain health problems  Conditions that affect hormones, such as thyroid disease or polycystic ovary syndrome.  Diabetes.  Disorders that cause the body's disease-fighting system to attack itself by mistake.  Infections.  Bleeding problems.  Being very overweight. Lifestyle factors  Using products that have tobacco or nicotine in them.  Being around tobacco smoke.  Having a lot of caffeine.  Using drugs. Problems with reproductive organs or parts  Having a cervix that opens and thins before you are ready to give birth. The cervix is the lowest part of the womb.  Having Asherman syndrome, which leads to: ? Scars in the womb. ? The womb being an abnormal shape.  Growths (fibroids) in the womb.  Problems in the body that are present at birth.  Infection of the cervix or womb. Personal or health history  Injury.  Having lost an unborn baby before.  Being younger than age 18 or older than age 35.  Being around a harmful substance, such as radiation.  Having lead or other heavy metals in: ? Things you eat or drink. ? The air around you.  Using certain medicines. What are the signs or symptoms?  Bleeding from the vagina. You may also have cramps or pain.  Mild pain or cramps in your belly. How is this diagnosed?  The doctor will do tests, such as an ultrasound to make sure you are still pregnant.   How is this treated? There are no treatments that prevent loss of  pregnancy. But you need to do the right things to take care of yourself at home. Follow these instructions at home:  Get a lot of rest.  Do not have sex or douche if there is bleeding in the vagina.  Do not put things such as tampons in the vagina if it is bleeding.  Do not smoke or use drugs.  Do not drink alcohol.  Avoid caffeine.  Keep all follow-up visits while you are pregnant. Contact a doctor if:  You are pregnant and you have one of these: ? Light bleeding coming from your vagina. ? Spots of blood coming from your vagina.  You have belly pain or cramping.  You have a fever. Get help right away if:  Blood soaks through 2 large pads an hour for more than 2 hours.  Clots of blood come from your vagina.  Tissue comes out of your vagina.  Fluid leaks or gushes from your vagina.  You have very bad pain in your low back.  You have very bad cramps in your belly.  You have a fever, chills, and very bad belly pain. Summary  A threatened miscarriage is when a woman bleeds in her vagina during the first 20 weeks of pregnancy but the pregnancy has not ended.  Normally, the cause of this condition is not known.  Symptoms include bleeding in the vagina or mild pain or cramps in your belly.  There are no treatments that prevent loss of pregnancy.  Keep   all follow-up visits while you are pregnant. This information is not intended to replace advice given to you by your health care provider. Make sure you discuss any questions you have with your health care provider. Document Revised: 02/19/2020 Document Reviewed: 02/19/2020 Elsevier Patient Education  2021 Elsevier Inc.  

## 2020-10-05 NOTE — Progress Notes (Signed)
PT is present today for confirmation of pregnancy. Pt LMP 08/24/2020. UPT done today results were positive. Pt c/o vaginal bleeding and spotting. Pt was seen and treated at the ED for the symptoms.

## 2020-10-05 NOTE — Progress Notes (Signed)
GYNECOLOGY CLINIC PROGRESS NOTE Subjective:    Kari Tanner is a 35 y.o. [phone] female. Marcee reports bleeding and pelvic cramping since last week. She was seen in the Emergency Room on 10/03/2020 for pelvic cramping and spotting. IUP was visualized at 5.2 weeks, but unable to detect viability at this time.  She is not in acute distress. Ectopic risks: none.  She was last seen at [facility] for her last pregnancy in 2017.   Reports having COVID infection shortly before finding out that she was pregnant.  Was worried that taking the Nyquil may be putting her at risk for miscarriage.   H/o DM, recently started back taking Metformin (this week) and working on improving diet.  Last A1c was 10 or 11 in October.   Cycle length: regular, varies from 30-34 days. Pregnancy testing: at home and quant HCG level 2187 on 10/03/2020. Marland Kitchen Pregnancy imaging: transvaginal ultrasound done on 10/03/2020. Result (see below). Blood type: O positive. Other lab results: hematocrit 37.1 and urinalysis normal.  The following portions of the patient's history were reviewed and updated as appropriate: She  has a past medical history of Allergy, Diabetes mellitus without complication (HCC), History of miscarriage, currently pregnant, Obesity affecting pregnancy (2016), and Ovarian cyst.   She  has a past surgical history that includes c sections; Ovarian cyst surgery (Right, 2007); Cesarean section; and Cesarean section (N/A, 11/14/2015).   Her family history includes Diabetes in her maternal aunt, maternal grandfather, and mother; Heart disease in her maternal grandfather and paternal grandmother; Hyperlipidemia in her paternal grandmother; Hypertension in her mother and paternal grandmother; Mental illness in her maternal grandmother.   She  reports that she has quit smoking. She has never used smokeless tobacco. She reports previous alcohol use. She reports that she does not use drugs.   She has  a current medication list which includes the following prescription(s): metformin and rizatriptan.   She has No Known Allergies..  Review of Systems Pertinent items noted in HPI and remainder of comprehensive ROS otherwise negative.   Objective:     BP 112/70   Pulse (!) 105   Ht 5\' 3"  (1.6 m)   Wt 222 lb 12.8 oz (101.1 kg)   LMP 08/24/2020   BMI 39.47 kg/m  General:   alert and no distress  Abdomen: soft, non-tender, without masses or organomegaly                 Vulva: normal              Vagina:  scant blood, dark red with small clot.                Cervix: no lesions and closed               Uterus: normal size              Adnexa: not evaluated      Extremities:  Non-tender, no edema.       Neurologic:  Grossly Normal.     Labs:  Admission on 10/03/2020, Discharged on 10/03/2020  Component Date Value Ref Range Status  . Lipase 10/03/2020 35  11 - 51 U/L Final   Performed at [facility], [address] [place] [place], [place]  . Sodium 10/03/2020 134* 135 - 145 mmol/L Final  . Potassium 10/03/2020 4.1  3.5 - 5.1 mmol/L Final  . Chloride 10/03/2020 100  98 - 111 mmol/L Final  . CO2 10/03/2020  23  22 - 32 mmol/L Final  . Glucose, Bld 10/03/2020 345* 70 - 99 mg/dL Final   Glucose reference range applies only to samples taken after fasting for at least 8 hours.  . BUN 10/03/2020 15  6 - 20 mg/dL Final  . Creatinine, Ser 10/03/2020 0.86  0.44 - 1.00 mg/dL Final  . Calcium 40/81/4481 9.9  8.9 - 10.3 mg/dL Final  . Total Protein 10/03/2020 7.6  6.5 - 8.1 g/dL Final  . Albumin 85/63/1497 3.7  3.5 - 5.0 g/dL Final  . AST 02/63/7858 18  15 - 41 U/L Final  . ALT 10/03/2020 18  0 - 44 U/L Final  . Alkaline Phosphatase 10/03/2020 75  38 - 126 U/L Final  . Total Bilirubin 10/03/2020 0.5  0.3 - 1.2 mg/dL Final  . GFR, Estimated 10/03/2020 >60  >60 mL/min Final   Comment: (NOTE) Calculated using the CKD-EPI Creatinine Equation (2021)   . Anion gap 10/03/2020 11   5 - 15 Final   Performed at [facility], [address] [place], [place] [address]  . WBC 10/03/2020 8.6  4.0 - 10.5 K/uL Final  . RBC 10/03/2020 4.25  3.87 - 5.11 MIL/uL Final  . Hemoglobin 10/03/2020 12.7  12.0 - 15.0 g/dL Final  . HCT 74/08/8785 37.1  36.0 - 46.0 % Final  . MCV 10/03/2020 87.3  80.0 - 100.0 fL Final  . MCH 10/03/2020 29.9  26.0 - 34.0 pg Final  . MCHC 10/03/2020 34.2  30.0 - 36.0 g/dL Final  . RDW 76/72/0947 12.0  11.5 - 15.5 % Final  . Platelets 10/03/2020 320  150 - 400 K/uL Final  . nRBC 10/03/2020 0.0  0.0 - 0.2 % Final   Performed at [facility], [address] [place], [place] [address]  . Color, Urine 10/03/2020 STRAW* YELLOW Final  . APPearance 10/03/2020 CLEAR* CLEAR Final  . Specific Gravity, Urine 10/03/2020 1.029  1.005 - 1.030 Final  . pH 10/03/2020 5.0  5.0 - 8.0 Final  . Glucose, UA 10/03/2020 >=500* NEGATIVE mg/dL Final  . Hgb urine dipstick 10/03/2020 NEGATIVE  NEGATIVE Final  . Bilirubin Urine 10/03/2020 NEGATIVE  NEGATIVE Final  . Ketones, ur 10/03/2020 NEGATIVE  NEGATIVE mg/dL Final  . Protein, ur 36/62/9476 NEGATIVE  NEGATIVE mg/dL Final  . Nitrite 54/65/0354 NEGATIVE  NEGATIVE Final  . Leukocytes,Ua 10/03/2020 NEGATIVE  NEGATIVE Final  . WBC, UA 10/03/2020 0-5  0 - 5 WBC/hpf Final  . Bacteria, UA 10/03/2020 NONE SEEN  NONE SEEN Final  . Squamous Epithelial / LPF 10/03/2020 0-5  0 - 5 Final   Performed at [facility], [address], [place], [place] [address]  . Preg Test, Ur 10/03/2020 POSITIVE* NEGATIVE Final   Comment:        THE SENSITIVITY OF THIS METHODOLOGY IS >24 mIU/mL   . hCG, Beta Chain, Quant, S 10/03/2020 2,187* <5 mIU/mL Final   Comment:          GEST. AGE      CONC.  (mIU/mL)   <=1 WEEK        5 - 50     2 WEEKS       50 - 500     3 WEEKS       100 - 10,000     4 WEEKS     1,000 - 30,000     5 WEEKS     3,500 - 115,000   6-8 WEEKS  12,000 - 270,000    12 WEEKS     15,000 -  220,000        FEMALE AND NON-PREGNANT FEMALE:     LESS THAN 5 mIU/mL Performed at [facility], [address] [place], [place] [address]     Imaging:  US OB LESS THAN 14 WEEKS WITH OB TRANSVAGINAL CLINICAL DATA:  Pregnant patient in first-trimester pregnancy with vaginal bleeding. Beta HCG 09/22/1985  EXAM: OBSTETRIC <14 WK [place] AND TRANSVAGINAL OB US  TECHNIQUE: Both transabdominal and transvaginal ultrasound examinations were performed for complete evaluation of the gestation as well as the maternal uterus, adnexal regions, and pelvic cul-de-sac. Transvaginal technique was performed to assess early pregnancy.  COMPARISON:  None this pregnancy.  FINDINGS: Intrauterine gestational sac: Single.  Yolk sac:  Not Visualized.  Embryo:  Not Visualized.  MSD: 5.8 mm   5 w   2 d  Subchorionic hemorrhage:  None visualized.  Maternal uterus/adnexae: Probable early intrauterine gestational sac with mean sac diameter of 5.8 mm. No yolk sac or fetal pole yet visualized. No subchorionic hemorrhage. Right ovary appears normal measuring 3.0 x 2.2 x 2.2 cm. Blood flow is noted. Cyst within the left ovary measuring 3.1 x 2.9 x 3 cm is likely a corpus luteum. Ovarian blood flow is seen. No adnexal mass or pelvic free fluid.  IMPRESSION: Probable early intrauterine gestational sac, but no yolk sac, fetal pole, or cardiac activity yet visualized. Recommend follow-up quantitative B-HCG levels and follow-up US in 14 days to assess viability. This recommendation follows SRU consensus guidelines: Diagnostic Criteria for Nonviable Pregnancy Early in the First Trimester. Malva Limes Med 2013; 106:2694-85.  Electronically Signed   By: Narda Rutherford M.D.   On: 10/03/2020 20:44    Assessment:   Threatened abortion Uncontrolled DM Advanced maternal age  Plan:   - Blood type and Rh: O positive. No need for Rhogam.  - Quantitative hCG today and follow trend. Will notify  patient of lab results.  - Transvaginal ultrasound in 2 weeks to reassess viability.  - Discussed risks of uncontrolled DM in pregnancy, including increased risk of miscarriage and cardiac malformations. Will recheck HgbA1c as baseline for pregnancy.  - To encourage early genetic screening due to AMA status around NOB physical.  - Safe medication list in pregnancy included in AVS.   RTC in 2 weeks for NOB intake and viability scan.

## 2020-10-06 LAB — HUMAN CHORIONIC GONADOTROPIN(HCG),B-SUBUNIT,QUANTITATIVE): HCG, Beta Chain, Quant, S: 1991 m[IU]/mL

## 2020-10-06 LAB — HEMOGLOBIN A1C
Est. average glucose Bld gHb Est-mCnc: 243 mg/dL
Hgb A1c MFr Bld: 10.1 % — ABNORMAL HIGH (ref 4.8–5.6)

## 2020-10-18 ENCOUNTER — Ambulatory Visit (INDEPENDENT_AMBULATORY_CARE_PROVIDER_SITE_OTHER): Payer: Medicaid Other

## 2020-10-18 ENCOUNTER — Other Ambulatory Visit: Payer: Self-pay

## 2020-10-18 ENCOUNTER — Other Ambulatory Visit: Payer: Medicaid Other

## 2020-10-18 DIAGNOSIS — O200 Threatened abortion: Secondary | ICD-10-CM | POA: Diagnosis not present

## 2021-01-20 ENCOUNTER — Encounter: Payer: Self-pay | Admitting: Emergency Medicine

## 2021-01-20 ENCOUNTER — Emergency Department
Admission: EM | Admit: 2021-01-20 | Discharge: 2021-01-20 | Disposition: A | Discharge status: Home / Self Care | Payer: Medicaid Other | Attending: Emergency Medicine | Admitting: Emergency Medicine

## 2021-01-20 ENCOUNTER — Other Ambulatory Visit: Payer: Self-pay

## 2021-01-20 DIAGNOSIS — Z3A20 20 weeks gestation of pregnancy: Secondary | ICD-10-CM | POA: Diagnosis not present

## 2021-01-20 DIAGNOSIS — O26892 Other specified pregnancy related conditions, second trimester: Secondary | ICD-10-CM | POA: Diagnosis not present

## 2021-01-20 DIAGNOSIS — R42 Dizziness and giddiness: Secondary | ICD-10-CM | POA: Insufficient documentation

## 2021-01-20 LAB — CBC
HCT: 38.6 % (ref 36.0–46.0)
Hemoglobin: 13.2 g/dL (ref 12.0–15.0)
MCH: 29.9 pg (ref 26.0–34.0)
MCHC: 34.2 g/dL (ref 30.0–36.0)
MCV: 87.5 fL (ref 80.0–100.0)
Platelets: 287 10*3/uL (ref 150–400)
RBC: 4.41 MIL/uL (ref 3.87–5.11)
RDW: 12.7 % (ref 11.5–15.5)
WBC: 9.2 10*3/uL (ref 4.0–10.5)
nRBC: 0 % (ref 0.0–0.2)

## 2021-01-20 LAB — BASIC METABOLIC PANEL
Anion gap: 10 (ref 5–15)
BUN: 12 mg/dL (ref 6–20)
CO2: 22 mmol/L (ref 22–32)
Calcium: 9.8 mg/dL (ref 8.9–10.3)
Chloride: 100 mmol/L (ref 98–111)
Creatinine, Ser: 0.72 mg/dL (ref 0.44–1.00)
GFR, Estimated: >60 mL/min (ref >60)
Glucose, Bld: 152 mg/dL — ABNORMAL HIGH (ref 70–99)
Potassium: 3.7 mmol/L (ref 3.5–5.1)
Sodium: 132 mmol/L — ABNORMAL LOW (ref 135–145)

## 2021-01-20 LAB — CBG MONITORING, ED: Glucose-Capillary: 149 mg/dL — ABNORMAL HIGH (ref 70–99)

## 2021-01-20 NOTE — ED Notes (Signed)
L&D called to see if they would be willing to see pt since she is [redacted]w[redacted]d preg, per RN pt to be seen in ED first.

## 2021-01-20 NOTE — ED Notes (Signed)
Pts mother approached this RN and asked what the hold up was on pts care, very upset that we are not sending pt to L& D for monitoring. Explained to pts mother that we are working on lab work for the pt to determine next steps in care. Pts mother not happy with answer received and pt seen being rolled out of WR in [facility] by mother.

## 2021-01-20 NOTE — ED Triage Notes (Signed)
Pt to ED via POV with c/o light-headedness and dizziness that started approx 1 hr ago. Pt is G7P4, is currently [redacted]W[redacted]D. Pt denies vaginal bleeding at this time. Pt states is diabetic and "something is wrong with the baby phsyically".

## 2021-08-17 ENCOUNTER — Encounter: Payer: Self-pay | Admitting: Emergency Medicine

## 2021-08-17 ENCOUNTER — Other Ambulatory Visit: Payer: Self-pay

## 2021-08-17 ENCOUNTER — Ambulatory Visit
Admission: EM | Admit: 2021-08-17 | Discharge: 2021-08-17 | Disposition: A | Discharge status: Home / Self Care | Payer: Medicaid Other | Attending: Physician Assistant | Admitting: Physician Assistant

## 2021-08-17 DIAGNOSIS — J028 Acute pharyngitis due to other specified organisms: Secondary | ICD-10-CM | POA: Insufficient documentation

## 2021-08-17 DIAGNOSIS — B9789 Other viral agents as the cause of diseases classified elsewhere: Secondary | ICD-10-CM | POA: Insufficient documentation

## 2021-08-17 DIAGNOSIS — Z87891 Personal history of nicotine dependence: Secondary | ICD-10-CM | POA: Insufficient documentation

## 2021-08-17 DIAGNOSIS — Z28310 Unvaccinated for covid-19: Secondary | ICD-10-CM | POA: Insufficient documentation

## 2021-08-17 DIAGNOSIS — U071 COVID-19: Secondary | ICD-10-CM | POA: Diagnosis not present

## 2021-08-17 DIAGNOSIS — R519 Headache, unspecified: Secondary | ICD-10-CM | POA: Insufficient documentation

## 2021-08-17 DIAGNOSIS — J029 Acute pharyngitis, unspecified: Secondary | ICD-10-CM | POA: Diagnosis not present

## 2021-08-17 NOTE — ED Triage Notes (Signed)
Pt c/o headache, sore throat. Started yesterday. Pt states her husband has covid. Denies fever.

## 2021-08-17 NOTE — ED Provider Notes (Signed)
[facility]    CSN: [id number] Arrival date & time: 08/17/21  1635      History   Chief Complaint Chief Complaint  Patient presents with   Covid Exposure    HPI Kari Tanner is a 35 y.o. female presenting for mild sore throat and headache that started yesterday.  She denies any fever, fatigue, body aches, cough, congestion, breathing problem, vomiting or diarrhea.  Reports that her husband currently has COVID-19.  She says she has a baby in the NICU and wants to make sure she does not have COVID before going to see the baby.  Patient has not taken anything for her symptoms.  Says she does not feel too badly.  She has not been vaccinated for COVID-19.  She does have a history of diabetes.  No other complaints.  HPI  Past Medical History:  Diagnosis Date   Allergy    Diabetes mellitus without complication (HCC)    History of miscarriage, currently pregnant    Obesity affecting pregnancy 2016   Ovarian cyst     Patient Active Problem List   Diagnosis Date Noted   Chronic intractable headache 12/27/2017   Type 2 diabetes mellitus without complication, without long-term current use of insulin (HCC) 12/27/2017   Hypotension 12/27/2017   Medication side effects 12/27/2017   Dizziness 12/27/2017   S/P cesarean section 11/14/2015   Gestational diabetes mellitus 10/21/2015   Labor and delivery, indication for care 10/11/2015   Polyhydramnios 09/28/2015   Supervision of high risk pregnancy in third trimester 08/16/2015   Diabetes mellitus during pregnancy, antepartum 07/04/2015   History of preterm delivery, currently pregnant in third trimester 07/04/2015   Obesity affecting pregnancy, antepartum 07/04/2015    Past Surgical History:  Procedure Laterality Date   c sections     CESAREAN SECTION     CESAREAN SECTION N/A 11/14/2015   Procedure: REPEAT CESAREAN SECTION;  Surgeon: Hildred Laser, MD;  Location: [facility] ORS;  Service: Obstetrics;  Laterality: N/A;    OVARIAN CYST SURGERY Right 2007    OB History     Gravida  7   Para  4   Term  2   Preterm  2   AB  2   Living  4      SAB  2   IAB      Ectopic      Multiple  0   Live Births  4            Home Medications    Prior to Admission medications   Medication Sig Start Date End Date Taking? Authorizing Provider  metFORMIN (GLUCOPHAGE) 500 MG tablet Take 1 tablet (500 mg total) by mouth 2 (two) times daily with a meal. Patient taking differently: Take 500 mg by mouth 2 (two) times daily with a meal. Take 2 tablets twice daily with meals. 12/27/17   Georgina Quint, MD  rizatriptan (MAXALT) 5 MG tablet Take 1 tablet (5 mg total) by mouth as needed for migraine. May repeat in 2 hours if needed Patient not taking: Reported on 10/05/2020 12/25/17   Georgina Quint, MD    Family History Family History  Problem Relation Age of Onset   Diabetes Mother    Hypertension Mother    Diabetes Maternal Aunt    Diabetes Maternal Grandfather    Heart disease Maternal Grandfather    Hypertension Paternal Grandmother    Heart disease Paternal Grandmother    Hyperlipidemia Paternal Grandmother  Mental illness Maternal Grandmother     Social History Social History   Tobacco Use   Smoking status: Former   Smokeless tobacco: Never  Substance Use Topics   Alcohol use: Not Currently    Comment: occasional   Drug use: No     Allergies   Patient has no known allergies.   Review of Systems Review of Systems  Constitutional:  Negative for chills, diaphoresis, fatigue and fever.  HENT:  Positive for sore throat. Negative for congestion, ear pain, rhinorrhea, sinus pressure and sinus pain.   Respiratory:  Negative for cough and shortness of breath.   Gastrointestinal:  Negative for abdominal pain, nausea and vomiting.  Musculoskeletal:  Negative for arthralgias and myalgias.  Skin:  Negative for rash.  Neurological:  Positive for headaches. Negative for  weakness.  Hematological:  Negative for adenopathy.    Physical Exam Triage Vital Signs ED Triage Vitals  Enc Vitals Group     BP 08/17/21 1736 125/81     Pulse Rate 08/17/21 1736 100     Resp 08/17/21 1736 18     Temp 08/17/21 1736 98.8 F (37.1 C)     Temp Source 08/17/21 1736 Oral     SpO2 08/17/21 1736 99 %     Weight 08/17/21 1734 244 lb 0.8 oz (110.7 kg)     Height 08/17/21 1734 5\' 3"  (1.6 m)     Head Circumference --      Peak Flow --      Pain Score 08/17/21 1733 1     Pain Loc --      Pain Edu? --      Excl. in [place]? --    No data found.  Updated Vital Signs BP 125/81 (BP Location: Left Arm)    Pulse 100    Temp 98.8 F (37.1 C) (Oral)    Resp 18    Ht 5\' 3"  (1.6 m)    Wt 244 lb 0.8 oz (110.7 kg)    LMP 08/14/2021    SpO2 99%    BMI 43.23 kg/m     Physical Exam Vitals and nursing note reviewed.  Constitutional:      General: She is not in acute distress.    Appearance: Normal appearance. She is not ill-appearing or toxic-appearing.  HENT:     Head: Normocephalic and atraumatic.     Nose: Nose normal.     Mouth/Throat:     Mouth: Mucous membranes are moist.     Pharynx: Oropharynx is clear. Posterior oropharyngeal erythema (mild) present.  Eyes:     General: No scleral icterus.       Right eye: No discharge.        Left eye: No discharge.     Conjunctiva/sclera: Conjunctivae normal.  Cardiovascular:     Rate and Rhythm: Normal rate and regular rhythm.     Heart sounds: Normal heart sounds.  Pulmonary:     Effort: Pulmonary effort is normal. No respiratory distress.     Breath sounds: Normal breath sounds.  Musculoskeletal:     Cervical back: Neck supple.  Skin:    General: Skin is dry.  Neurological:     General: No focal deficit present.     Mental Status: She is alert. Mental status is at baseline.     Motor: No weakness.     Gait: Gait normal.  Psychiatric:        Mood and Affect: Mood normal.  Behavior: Behavior normal.        Thought  Content: Thought content normal.     UC Treatments / Results  Labs (all labs ordered are listed, but only abnormal results are displayed) Labs Reviewed  SARS CORONAVIRUS 2 (TAT 6-24 HRS)    EKG   Radiology No results found.  Procedures Procedures (including critical care time)  Medications Ordered in UC Medications - No data to display  Initial Impression / Assessment and Plan / UC Course  I have reviewed the triage vital signs and the nursing notes.  Pertinent labs & imaging results that were available during my care of the patient were reviewed by me and considered in my medical decision making (see chart for details).  35 year old female presenting for sore throat and headache that started yesterday.  Exposed to [place] through her husband.  Not vaccinated for COVID.  History of diabetes.  Vitals all normal and stable.  She is overall well-appearing.  Mild erythema posterior pharynx, otherwise normal exam.  PCR COVID test obtained.  Current CDC guidelines, isolation protocol and ED precautions reviewed if COVID-positive.  Patient is candidate for antiviral medication if she is positive.  Would prefer Molnupiravir.  Reviewed how to access results.  Advised we will call if positive.  Supportive care with increasing rest and fluids.  Advised if she is negative take another test in a couple of days.  Still suggest that she stay away from the baby as long as she is sick.   Final Clinical Impressions(s) / UC Diagnoses   Final diagnoses:  Viral pharyngitis  Acute nonintractable headache, unspecified headache type     Discharge Instructions      You have received COVID testing today either for positive exposure, concerning symptoms that could be related to COVID infection, screening purposes, or re-testing after confirmed positive.  Your test obtained today checks for active viral infection in the last 1-2 weeks. If your test is negative now, you can still test positive later.  So, if you do develop symptoms you should either get re-tested and/or isolate x 5 days and then strict mask use x 5 days (unvaccinated) or mask use x 10 days (vaccinated). Please follow CDC guidelines.  While Rapid antigen tests come back in 15-20 minutes, send out PCR/molecular test results typically come back within 1-3 days. In the mean time, if you are symptomatic, assume this could be a positive test and treat/monitor yourself as if you do have COVID.   We will call with test results if positive. Please download the MyChart app and set up a profile to access test results.   If symptomatic, go home and rest. Push fluids. Take Tylenol as needed for discomfort. Gargle warm salt water. Throat lozenges. Take Mucinex DM or Robitussin for cough. Humidifier in bedroom to ease coughing. Warm showers. Also review the COVID handout for more information.  COVID-19 INFECTION: The incubation period of COVID-19 is approximately 14 days after exposure, with most symptoms developing in roughly 4-5 days. Symptoms may range in severity from mild to critically severe. Roughly 80% of those infected will have mild symptoms. People of any age may become infected with COVID-19 and have the ability to transmit the virus. The most common symptoms include: fever, fatigue, cough, body aches, headaches, sore throat, nasal congestion, shortness of breath, nausea, vomiting, diarrhea, changes in smell and/or taste.    COURSE OF ILLNESS Some patients may begin with mild disease which can progress quickly into critical symptoms. If your symptoms  are worsening please call ahead to the Emergency Department and proceed there for further treatment. Recovery time appears to be roughly 1-2 weeks for mild symptoms and 3-6 weeks for severe disease.   GO IMMEDIATELY TO ER FOR FEVER YOU ARE UNABLE TO GET DOWN WITH TYLENOL, BREATHING PROBLEMS, CHEST PAIN, FATIGUE, LETHARGY, INABILITY TO EAT OR DRINK, ETC  QUARANTINE AND ISOLATION: To help  decrease the spread of COVID-19 please remain isolated if you have COVID infection or are highly suspected to have COVID infection. This means -stay home and isolate to one room in the home if you live with others. Do not share a bed or bathroom with others while ill, sanitize and wipe down all countertops and keep common areas clean and disinfected. Stay home for 5 days. If you have no symptoms or your symptoms are resolving after 5 days, you can leave your house. Continue to wear a mask around others for 5 additional days. If you have been in close contact (within 6 feet) of someone diagnosed with COVID 19, you are advised to quarantine in your home for 14 days as symptoms can develop anywhere from 2-14 days after exposure to the virus. If you develop symptoms, you  must isolate.  Most current guidelines for COVID after exposure -unvaccinated: isolate 5 days and strict mask use x 5 days. Test on day 5 is possible -vaccinated: wear mask x 10 days if symptoms do not develop -You do not necessarily need to be tested for COVID if you have + exposure and  develop symptoms. Just isolate at home x10 days from symptom onset During this global pandemic, CDC advises to practice social distancing, try to stay at least 98ft away from others at all times. Wear a face covering. Wash and sanitize your hands regularly and avoid going anywhere that is not necessary.  KEEP IN MIND THAT THE COVID TEST IS NOT 100% ACCURATE AND YOU SHOULD STILL DO EVERYTHING TO PREVENT POTENTIAL SPREAD OF VIRUS TO OTHERS (WEAR MASK, WEAR GLOVES, [place] HANDS AND SANITIZE REGULARLY). IF INITIAL TEST IS NEGATIVE, THIS MAY NOT MEAN YOU ARE DEFINITELY NEGATIVE. MOST ACCURATE TESTING IS DONE 5-7 DAYS AFTER EXPOSURE.   It is not advised by CDC to get re-tested after receiving a positive COVID test since you can still test positive for weeks to months after you have already cleared the virus.   *If you have not been vaccinated for COVID, I strongly  suggest you consider getting vaccinated as long as there are no contraindications.       ED Prescriptions   None    PDMP not reviewed this encounter.   Danton Clap, PA-C 08/17/21 1839

## 2021-08-17 NOTE — Discharge Instructions (Addendum)

## 2021-08-18 ENCOUNTER — Telehealth (HOSPITAL_COMMUNITY): Payer: Self-pay | Admitting: Emergency Medicine

## 2021-08-18 LAB — SARS CORONAVIRUS 2 (TAT 6-24 HRS): SARS Coronavirus 2: POSITIVE — AB

## 2021-08-18 MED ORDER — MOLNUPIRAVIR EUA 200MG CAPSULE: 4.0000 | ORAL_CAPSULE | Freq: Two times a day (BID) | ORAL | 0 refills | Status: AC

## 2021-11-17 IMAGING — US US OB < 14 WEEKS - US OB TV
1 series · 13 of 28 positions shown · non-contrast
Comparison: None this pregnancy.

CLINICAL DATA: Pregnant patient in first-trimester pregnancy with
vaginal bleeding. Beta HCG 09/22/1986

EXAM:
OBSTETRIC <14 WK US AND TRANSVAGINAL OB US
TECHNIQUE: Both transabdominal and transvaginal ultrasound examinations were
performed for complete evaluation of the gestation as well as the
maternal uterus, adnexal regions, and pelvic cul-de-sac.
Transvaginal technique was performed to assess early pregnancy.

[Series 1: us ob less than 14 weeks with ob transvaginal · 13 of 142 slices shown]
[im 6/142]
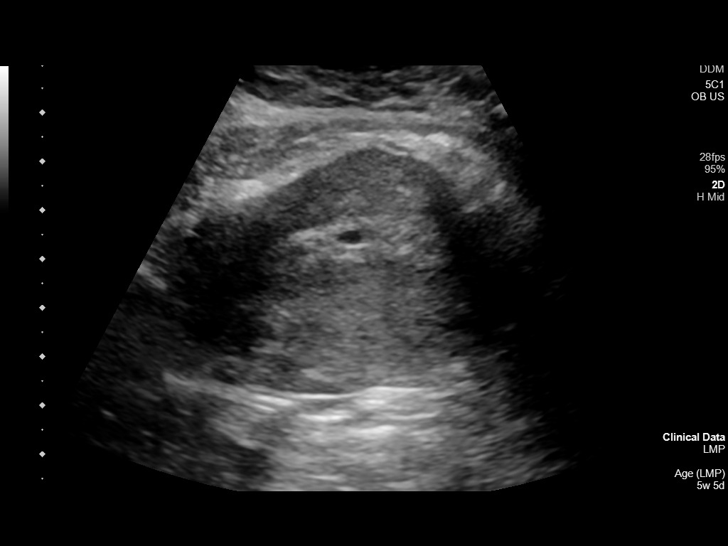
[im 16/142]
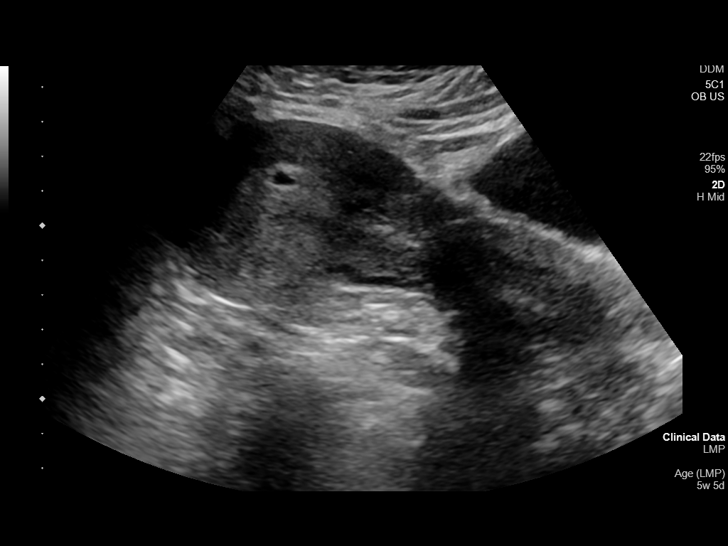
[im 27/142]
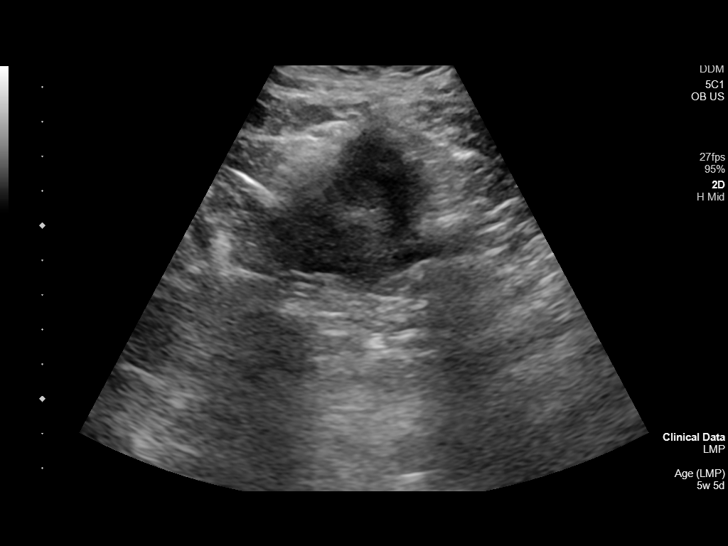
[im 37/142]
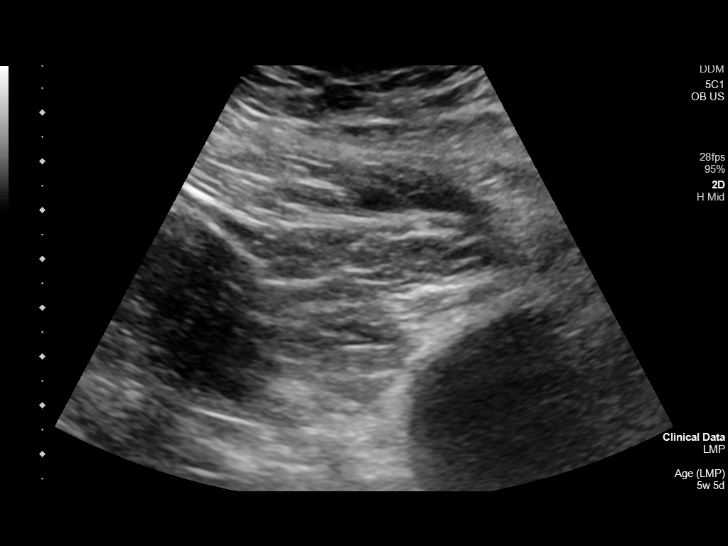
[im 48/142]
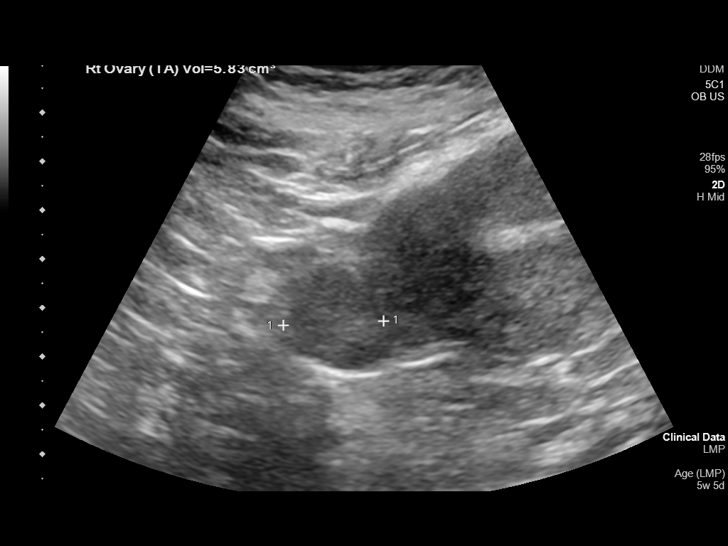
[im 58/142]
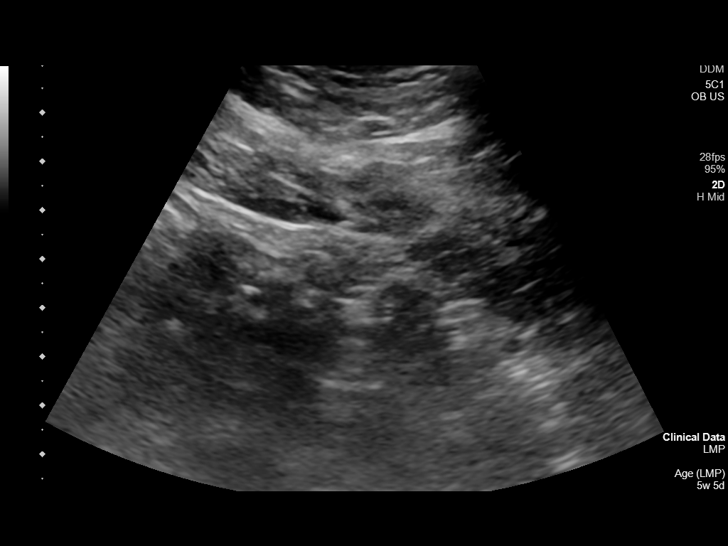
[im 74/142]
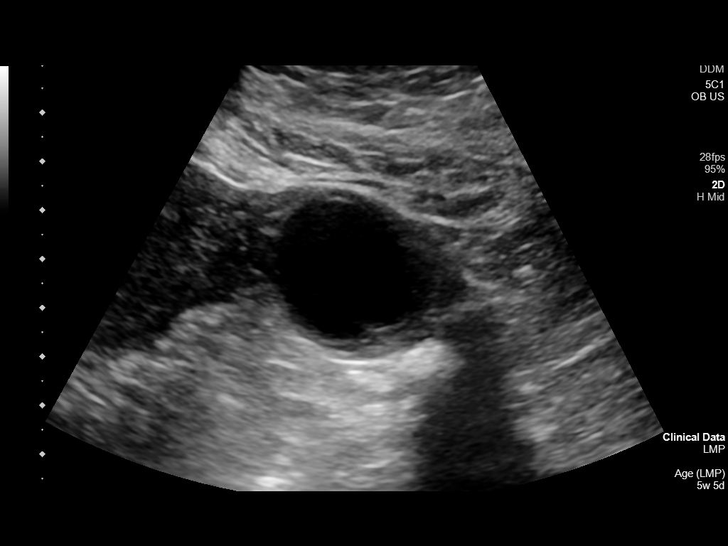
[im 84/142]
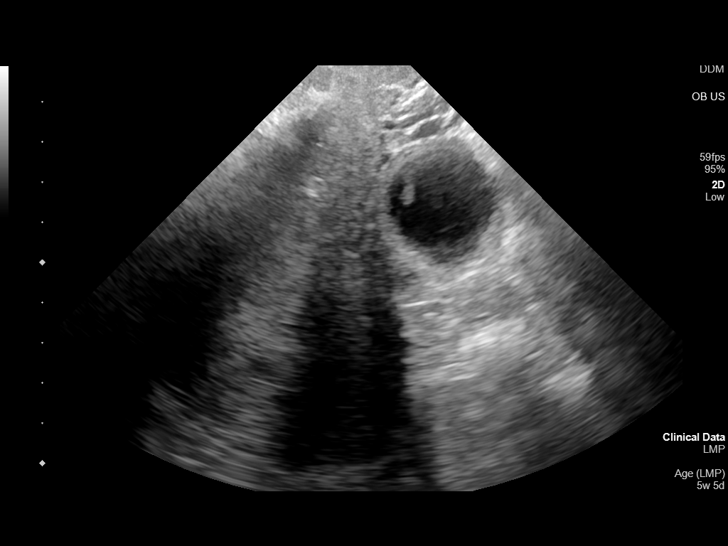
[im 95/142]
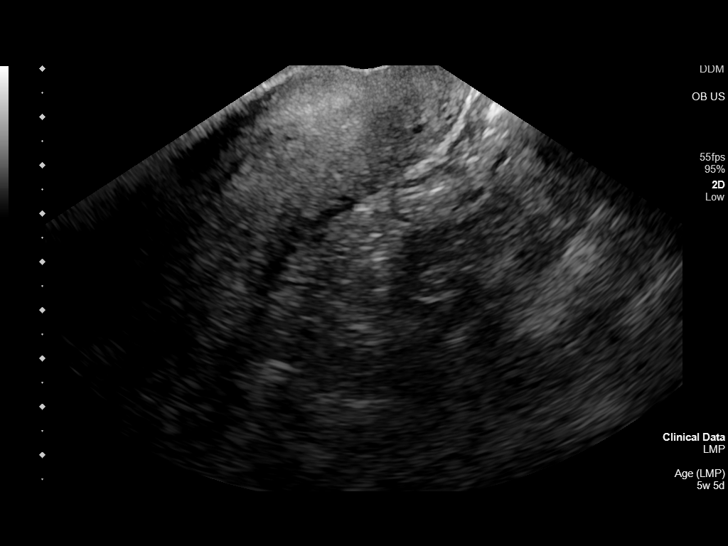
[im 105/142]
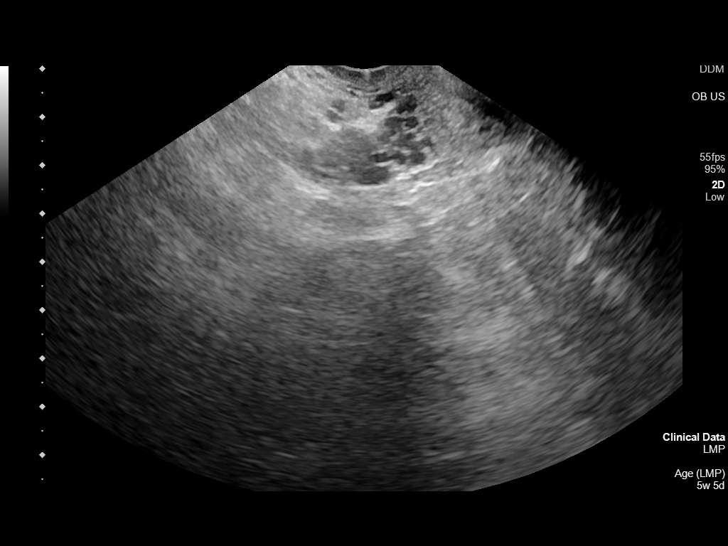
[im 115/142]
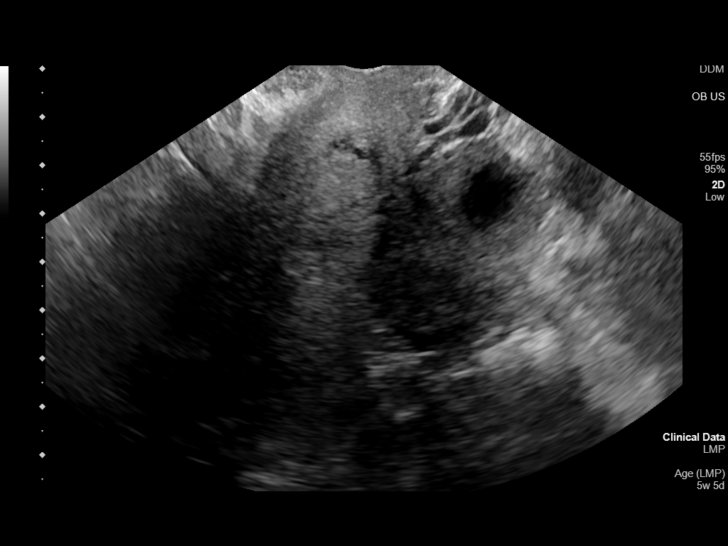
[im 126/142]
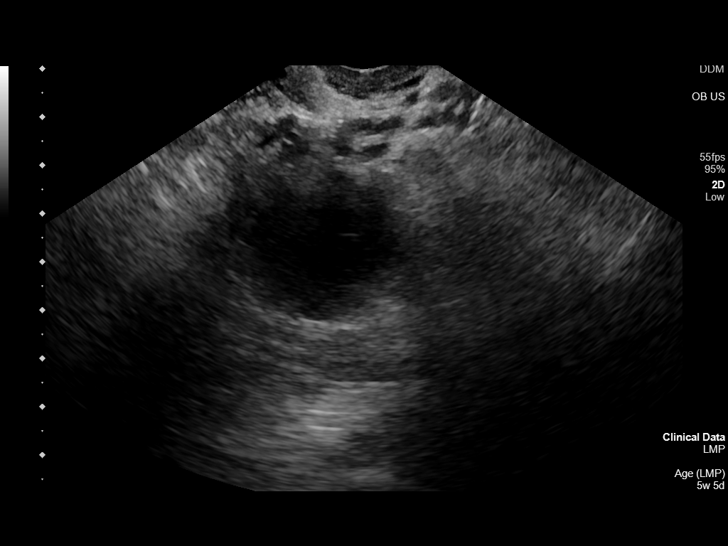
[im 136/142]
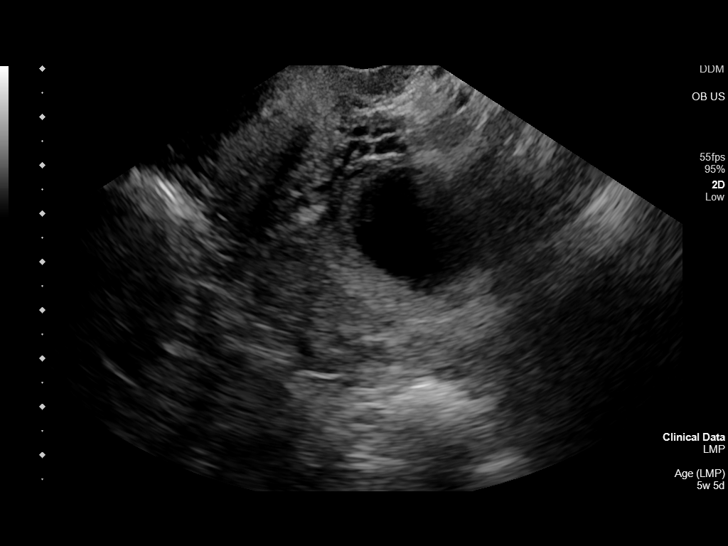

[13 of 28 positions shown; findings below may reference images not displayed]

FINDINGS: Intrauterine gestational sac: Single.

Yolk sac:  Not Visualized.

Embryo:  Not Visualized.

MSD: 5.8 mm   5 w   2 d

Subchorionic hemorrhage:  None visualized.

Maternal uterus/adnexae: Probable early intrauterine gestational sac
with mean sac diameter of 5.8 mm. No yolk sac or fetal pole yet
visualized. No subchorionic hemorrhage. Right ovary appears normal
measuring 3.0 x 2.2 x 2.2 cm. Blood flow is noted. Cyst within the
left ovary measuring 3.1 x 2.9 x 3 cm is likely a corpus luteum.
Ovarian blood flow is seen. No adnexal mass or pelvic free fluid.
IMPRESSION: Probable early intrauterine gestational sac, but no yolk sac, fetal
pole, or cardiac activity yet visualized. Recommend follow-up
quantitative B-HCG levels and follow-up US in 14 days to assess
viability. This recommendation follows SRU consensus guidelines:
Diagnostic Criteria for Nonviable Pregnancy Early in the First
Trimester. N Engl J Med 9013; [DATE].

## 2021-12-31 ENCOUNTER — Emergency Department
Admission: EM | Admit: 2021-12-31 | Discharge: 2021-12-31 | Disposition: A | Discharge status: Home / Self Care | Payer: Medicaid Other | Attending: Emergency Medicine | Admitting: Emergency Medicine

## 2021-12-31 ENCOUNTER — Other Ambulatory Visit: Payer: Self-pay

## 2021-12-31 ENCOUNTER — Emergency Department: Payer: Medicaid Other

## 2021-12-31 DIAGNOSIS — M25812 Other specified joint disorders, left shoulder: Secondary | ICD-10-CM | POA: Insufficient documentation

## 2021-12-31 DIAGNOSIS — M25512 Pain in left shoulder: Secondary | ICD-10-CM | POA: Diagnosis present

## 2021-12-31 DIAGNOSIS — M25819 Other specified joint disorders, unspecified shoulder: Secondary | ICD-10-CM

## 2021-12-31 LAB — POC URINE PREG, ED: Preg Test, Ur: NEGATIVE

## 2021-12-31 MED ORDER — CYCLOBENZAPRINE HCL 5 MG PO TABS
5.0000 mg | ORAL_TABLET | Freq: Three times a day (TID) | ORAL | 0 refills | Status: AC | PRN
Start: 2021-12-31 — End: ?

## 2021-12-31 MED ORDER — NAPROXEN 375 MG PO TABS
375.0000 mg | ORAL_TABLET | Freq: Two times a day (BID) | ORAL | 0 refills | Status: AC
Start: 2021-12-31 — End: ?

## 2021-12-31 NOTE — ED Provider Notes (Signed)
? ?[facility] ?Provider Note ? ? ? Event Date/Time  ? First MD Initiated Contact with Patient 12/31/21 1207   ?  (approximate) ? ? ?History  ? ?Shoulder Pain ? ? ?HPI ? ?Kari Tanner is a 36 y.o. female presents to the ER today with complaint of left shoulder and left arm pain.  She reports this started last night.  She describes the pain as sharp, pressure with associated numbness and tingling.  She denies weakness of her left upper extremity.  She denies neck pain.  She denies any injury to the area.  She has tried Aspirin OTC which did cause some nausea.  She reports her pain seemed to resolve overnight but she still had some persistent numbness this morning so she wanted to be evaluated.  She was concerned that she may be having a heart attack.  She denies any chest pain, chest tightness, shortness of breath. ? ?  ? ? ?Physical Exam  ? ?Triage Vital Signs: ?ED Triage Vitals  ?Enc Vitals Group  ?   BP 12/31/21 1203 119/77  ?   Pulse Rate 12/31/21 1202 91  ?   Resp 12/31/21 1202 18  ?   Temp 12/31/21 1202 98.4 ?F (36.9 ?C)  ?   Temp Source 12/31/21 1202 Oral  ?   SpO2 12/31/21 1202 97 %  ?   Weight 12/31/21 1203 228 lb (103.4 kg)  ?   Height 12/31/21 1203 5' 3.4" (1.61 m)  ?   Head Circumference --   ?   Peak Flow --   ?   Pain Score 12/31/21 1202 2  ?   Pain Loc --   ?   Pain Edu? --   ?   Excl. in [place]? --   ? ? ?Most recent vital signs: ?Vitals:  ? 12/31/21 1202 12/31/21 1203  ?BP:  119/77  ?Pulse: 91   ?Resp: 18   ?Temp: 98.4 ?F (36.9 ?C)   ?SpO2: 97%   ? ? ? ?General: Awake, no distress.  ?CV:  RRR.  Radial pulse 2+ on left. ?Resp:  CTA bilaterally. ?MSK:  Normal flexion, extension, rotation and lateral bending of the cervical spine.  No bony tenderness noted over the cervical spine.  Pain with palpation of the left paracervical muscles.  Normal internal and external rotation of the left shoulder.  Negative drop can test on the left shoulder.  No pain with palpation of the left  shoulder.  Strength 5/5 BUE.  Handgrips equal. ? ? ?ED Results / Procedures / Treatments  ? ?Labs ?Labs Reviewed  ?POC URINE PREG, ED  ? ? ?RADIOLOGY ? ?Imaging Orders    ?     DG Shoulder Left    ? ?IMPRESSION: No radiographic abnormality is seen in the left shoulder.  ? ? ?MEDICATIONS ORDERED IN ED: ?Medications - No data to display ? ? ?IMPRESSION / MDM / ASSESSMENT AND PLAN / ED COURSE  ?I reviewed the triage vital signs and the nursing notes. ? ?Left shoulder pain, left arm paresthesia ? ?Differential diagnosis includes, but is not limited to, cervical radiculitis, shoulder impingement, osteoarthritis of the left shoulder ? ?X-ray of the left shoulder is normal ?Discussed findings with patient, advised her that impingement may not always be seen on x-ray, although based on her exam this is the most likely diagnosis ?She is agreeable with discharge and close PCP follow-up ?Rx for Naproxen 375 mg twice daily x5 days ?Rx for Flexeril 5 mg every 8  hours as needed-sedation caution given ? ?FINAL CLINICAL IMPRESSION(S) / ED DIAGNOSES  ? ?Final diagnoses:  ?Shoulder impingement  ? ? ? ?Rx / DC Orders  ? ?ED Discharge Orders   ? ?      Ordered  ?  naproxen (NAPROSYN) 375 MG tablet  2 times daily with meals       ? 12/31/21 1344  ?  cyclobenzaprine (FLEXERIL) 5 MG tablet  3 times daily PRN       ? 12/31/21 1344  ? ?  ?  ? ?  ? ? ? ?Note:  This document was prepared using Dragon voice recognition software and may include unintentional dictation errors. ? ?  ?Lorre Munroe, NP ?12/31/21 1345 ? ?  ?Gilles Chiquito, MD ?12/31/21 1621 ? ?

## 2021-12-31 NOTE — ED Triage Notes (Signed)
Pt here with left shoulder pain since last night that traveled down her arm. Pt denies fall or injury.  ?

## 2021-12-31 NOTE — ED Notes (Addendum)
Pt complains of left shoulder pressure with pain radiating down arm.  ?

## 2021-12-31 NOTE — Discharge Instructions (Signed)
You were seen today for left shoulder and left arm pain.  Your exam is most consistent with shoulder impingement.  I am sending in anti-inflammatory medications and muscle relaxers.  Please be aware that the muscle laxer's may cause sedation.  Please follow-up with your PCP if symptoms persist or worsen. ?

## 2022-01-02 ENCOUNTER — Other Ambulatory Visit: Payer: Self-pay | Admitting: Internal Medicine

## 2022-01-02 NOTE — Telephone Encounter (Signed)
rx was signed 2 days ago by provider. Pt has different pcp ?Requested Prescriptions  ?Pending Prescriptions Disp Refills  ?? naproxen (NAPROSYN) 375 MG tablet [Pharmacy Med Name: NAPROXEN 375MG TABLETS] 10 tablet 0  ?  Sig: TAKE 1 TABLET(375 MG) BY MOUTH TWICE DAILY WITH A MEAL  ?  ? Analgesics:  NSAIDS Failed - 01/02/2022  9:16 AM  ?  ?  Failed - Manual Review: Labs are only required if the patient has taken medication for more than 8 weeks.  ?  ?  Failed - Valid encounter within last 12 months  ?  Recent Outpatient Visits   ?      ? 4 years ago Dizziness  ? Primary Care at [facility], [place], [place]  ? 4 years ago Chronic intractable headache, unspecified headache type  ? Primary Care at [facility], [place], [place]  ?  ?  ? ?  ?  ?  Passed - Cr in normal range and within 360 days  ?  Creatinine  ?Date Value Ref Range Status  ?06/14/2013 0.93 0.60 - 1.30 mg/dL Final  ? ?Creatinine, Ser  ?Date Value Ref Range Status  ?01/20/2021 0.72 0.44 - 1.00 mg/dL Final  ?   ?  ?  Passed - HGB in normal range and within 360 days  ?  Hemoglobin  ?Date Value Ref Range Status  ?01/20/2021 13.2 12.0 - 15.0 g/dL Final  ?12/25/2017 13.3 11.1 - 15.9 g/dL Final  ?   ?  ?  Passed - PLT in normal range and within 360 days  ?  Platelets  ?Date Value Ref Range Status  ?01/20/2021 287 150 - 400 K/uL Final  ?12/25/2017 330 150 - 379 x10E3/uL Final  ?   ?  ?  Passed - HCT in normal range and within 360 days  ?  HCT  ?Date Value Ref Range Status  ?01/20/2021 38.6 36.0 - 46.0 % Final  ? ?Hematocrit  ?Date Value Ref Range Status  ?12/25/2017 40.8 34.0 - 46.6 % Final  ?   ?  ?  Passed - eGFR is 30 or above and within 360 days  ?  EGFR (African American)  ?Date Value Ref Range Status  ?06/14/2013 >60  Final  ? ?GFR calc Af Wyvonnia Lora  ?Date Value Ref Range Status  ?12/25/2017 104 >59 mL/min/1.73 Final  ? ?EGFR (Non-African Amer.)  ?Date Value Ref Range Status  ?06/14/2013 >60  Final  ?  Comment:  ?  eGFR values <18m/min/1.73 m2 may be an  indication of chronic ?kidney disease (CKD). ?Calculated eGFR is useful in patients with stable renal function. ?The eGFR calculation will not be reliable in acutely ill patients ?when serum creatinine is changing rapidly. It is not useful in  ?patients on dialysis. The eGFR calculation may not be applicable ?to patients at the low and high extremes of body sizes, pregnant ?women, and vegetarians. ?  ? ?GFR, Estimated  ?Date Value Ref Range Status  ?01/20/2021 >60 >60 mL/min Final  ?  Comment:  ?  (NOTE) ?Calculated using the CKD-EPI Creatinine Equation (2021) ?  ?   ?  ?  Passed - Patient is not pregnant  ?  ?  ? ? ?

## 2022-12-17 ENCOUNTER — Encounter: Payer: Self-pay | Admitting: Emergency Medicine

## 2022-12-17 ENCOUNTER — Emergency Department
Admission: EM | Admit: 2022-12-17 | Discharge: 2022-12-17 | Disposition: A | Discharge status: Home / Self Care | Payer: Medicaid Other | Attending: Emergency Medicine | Admitting: Emergency Medicine

## 2022-12-17 ENCOUNTER — Other Ambulatory Visit: Payer: Self-pay

## 2022-12-17 DIAGNOSIS — R42 Dizziness and giddiness: Secondary | ICD-10-CM | POA: Insufficient documentation

## 2022-12-17 DIAGNOSIS — E119 Type 2 diabetes mellitus without complications: Secondary | ICD-10-CM | POA: Insufficient documentation

## 2022-12-17 LAB — BASIC METABOLIC PANEL
Anion gap: 9 (ref 5–15)
BUN: 10 mg/dL (ref 6–20)
CO2: 25 mmol/L (ref 22–32)
Calcium: 9.3 mg/dL (ref 8.9–10.3)
Chloride: 101 mmol/L (ref 98–111)
Creatinine, Ser: 0.67 mg/dL (ref 0.44–1.00)
GFR, Estimated: >60 mL/min (ref >60)
Glucose, Bld: 315 mg/dL — ABNORMAL HIGH (ref 70–99)
Potassium: 4 mmol/L (ref 3.5–5.1)
Sodium: 135 mmol/L (ref 135–145)

## 2022-12-17 LAB — CBC
HCT: 38.3 % (ref 36.0–46.0)
Hemoglobin: 12.7 g/dL (ref 12.0–15.0)
MCH: 28.7 pg (ref 26.0–34.0)
MCHC: 33.2 g/dL (ref 30.0–36.0)
MCV: 86.5 fL (ref 80.0–100.0)
Platelets: 298 10*3/uL (ref 150–400)
RBC: 4.43 MIL/uL (ref 3.87–5.11)
RDW: 11.9 % (ref 11.5–15.5)
WBC: 5.5 10*3/uL (ref 4.0–10.5)
nRBC: 0 % (ref 0.0–0.2)

## 2022-12-17 LAB — URINALYSIS, ROUTINE W REFLEX MICROSCOPIC
Bacteria, UA: NONE SEEN
Bilirubin Urine: NEGATIVE
Glucose, UA: >=500 mg/dL — AB
Hgb urine dipstick: NEGATIVE
Ketones, ur: NEGATIVE mg/dL
Leukocytes,Ua: NEGATIVE
Nitrite: NEGATIVE
Protein, ur: NEGATIVE mg/dL
Specific Gravity, Urine: 1.029 (ref 1.005–1.030)
pH: 6 (ref 5.0–8.0)

## 2022-12-17 LAB — POC URINE PREG, ED: Preg Test, Ur: NEGATIVE

## 2022-12-17 MED ORDER — MECLIZINE HCL 25 MG PO TABS
25.0000 mg | ORAL_TABLET | Freq: Once | ORAL | Status: AC
  Administered 2022-12-17: 25 mg via ORAL
  Filled 2022-12-17: qty 1

## 2022-12-17 MED ORDER — MECLIZINE HCL 25 MG PO TABS: 25.0000 mg | ORAL_TABLET | Freq: Three times a day (TID) | ORAL | 0 refills | Status: AC | PRN

## 2022-12-17 NOTE — ED Notes (Signed)
Pt family came to nurses station. Pt family states they need to leave right now. RN advised family she will go speak with Dr about discharge paperwork. RN walked back to room to talk to pt and family and they had already left without discharge paperwork or discharge vital signs.

## 2022-12-17 NOTE — ED Triage Notes (Signed)
Patient to ED via POV for dizziness and nausea. Patient states symptoms ongoing since yesterday. Ambulatory to triage.

## 2022-12-17 NOTE — ED Notes (Signed)
Pt provided with crackers and ice water per request. 

## 2022-12-17 NOTE — Discharge Instructions (Addendum)
Please go to the following website to schedule new (and existing) patient appointments:   [email or web address]   The following is a list of primary care offices in the area who are accepting new patients at this time.  Please reach out to one of them directly and let them know you would like to schedule an appointment to follow up on an Emergency Department visit, and/or to establish a new primary care provider (PCP).  There are likely other primary care clinics in the are who are accepting new patients, but this is an excellent place to start:  [facility] Lead physician: Dr Angela Bacigalupo [address] [place], [place] [address] [phone]  [facility] Lead Physician: Dr Krichna Sowles [address], [place], [place] [address] [phone]  [facility]  Lead Physician: Dr Megan Johnson [address], [place], [place] [address] [phone]  [facility] [place] [facility] Lead Physician: Dr Alex Karamalegos [address], [place], [place] [address] [phone]  [facility] [place] Lead Physician: Dr Laura Berglund [address], [place], [place] [address] [phone]   

## 2022-12-17 NOTE — ED Provider Notes (Signed)
[facility] Provider Note    Event Date/Time   First MD Initiated Contact with Patient 12/17/22 1243     (approximate)   History   Dizziness   HPI  Kari Tanner is a 37 y.o. female   past medical history of diabetes presents because of dizziness.  Symptoms started yesterday morning when she woke up.  She describes a spinning like sensation feeling like she is falling to the left.  Symptoms are worse with head movement but have not completely resolved and are present at rest.  Denies vision change numbness tingling weakness.  Does have some bilateral tinnitus.  Has had similar episodes but not lasting this long  No headache.     Past Medical History:  Diagnosis Date   Allergy    Diabetes mellitus without complication    History of miscarriage, currently pregnant    Obesity affecting pregnancy 2016   Ovarian cyst     Patient Active Problem List   Diagnosis Date Noted   Chronic intractable headache 12/27/2017   Type 2 diabetes mellitus without complication, without long-term current use of insulin 12/27/2017   Hypotension 12/27/2017   Medication side effects 12/27/2017   Dizziness 12/27/2017   S/P cesarean section 11/14/2015   Gestational diabetes mellitus 10/21/2015   Labor and delivery, indication for care 10/11/2015   Polyhydramnios 09/28/2015   Supervision of high risk pregnancy in third trimester 08/16/2015   Diabetes mellitus during pregnancy, antepartum 07/04/2015   History of preterm delivery, currently pregnant in third trimester 07/04/2015   Obesity affecting pregnancy, antepartum 07/04/2015     Physical Exam  Triage Vital Signs: ED Triage Vitals  Enc Vitals Group     BP 12/17/22 1136 122/88     Pulse Rate 12/17/22 1136 91     Resp 12/17/22 1136 18     Temp 12/17/22 1136 98.2 F (36.8 C)     Temp Source 12/17/22 1136 Oral     SpO2 12/17/22 1136 100 %     Weight 12/17/22 1134 218 lb (98.9 kg)     Height 12/17/22 1134 5'  3" (1.6 m)     Head Circumference --      Peak Flow --      Pain Score 12/17/22 1134 0     Pain Loc --      Pain Edu? --      Excl. in GC? --     Most recent vital signs: Vitals:   12/17/22 1136  BP: 122/88  Pulse: 91  Resp: 18  Temp: 98.2 F (36.8 C)  SpO2: 100%     General: Awake, no distress.  CV:  Good peripheral perfusion.  Resp:  Normal effort.  Abd:  No distention.  Neuro:             Awake, Alert, Oriented x 3  Other:  Aox3, nml speech  PERRL, EOMI, face symmetric, nml tongue movement  5/5 strength in the BL upper and lower extremities  Sensation grossly intact in the BL upper and lower extremities  Finger-nose-finger intact BL Normal gait no ataxia   ED Results / Procedures / Treatments  Labs (all labs ordered are listed, but only abnormal results are displayed) Labs Reviewed  BASIC METABOLIC PANEL - Abnormal; Notable for the following components:      Result Value   Glucose, Bld 315 (*)    All other components within normal limits  URINALYSIS, ROUTINE W REFLEX MICROSCOPIC - Abnormal; Notable for the following  components:   Color, Urine YELLOW (*)    APPearance CLEAR (*)    Glucose, UA >=500 (*)    All other components within normal limits  CBC  CBG MONITORING, ED  POC URINE PREG, ED     EKG  EKG interpretation performed by myself: NSR, nml axis, nml intervals, no acute ischemic changes    RADIOLOGY    PROCEDURES:  Critical Care performed: No  Procedures   MEDICATIONS ORDERED IN ED: Medications  meclizine (ANTIVERT) tablet 25 mg (25 mg Oral Given 12/17/22 1350)     IMPRESSION / MDM / ASSESSMENT AND PLAN / ED COURSE  I reviewed the triage vital signs and the nursing notes.                              Patient's presentation is most consistent with acute complicated illness / injury requiring diagnostic workup.  Differential diagnosis includes, but is not limited to, peripheral vertigo including BPPV, vestibular neuritis, low  suspicion for central vertigo such as posterior CVA, orthostatic hypotension, anemia, electrolyte abnormality  Given is a 37 year old female with diabetes not currently taking any medication for diabetes who presents because of dizziness since yesterday morning.  Started when she woke up yesterday describes it as spinning feeling and feeling like she is walking to the left.  It is clearly worse with standing up and with head movement.  Patient's vital signs are reassuring on exam she looks well does endorse ongoing dizzy feeling.  Her exam is reassuring she has no nystagmus normal finger-nose and she is not ataxic does not fall to the left has normal gait.    Given her benign exam and the fact that symptoms have improved I have very low suspicion for central process.  Plan to give meclizine and reassess.  Patient did have labs done from triage which are notable for blood sugar 315, no evidence of DKA.  Pregnancy test is negative.  Patient was previously on metformin but has not been taking it.  I counseled her on the importance of blood sugar control.  She was not interested in starting back on metformin.  I referred her to primary care.  Patient feeling improved after meclizine.  Again no exam findings to suggest central process.  Discussed appropriate use of meclizine for symptoms and follow-up with PCP.       FINAL CLINICAL IMPRESSION(S) / ED DIAGNOSES   Final diagnoses:  Dizziness  Vertigo     Rx / DC Orders   ED Discharge Orders          Ordered    meclizine (ANTIVERT) 25 MG tablet  3 times daily PRN        12/17/22 1502             Note:  This document was prepared using Dragon voice recognition software and may include unintentional dictation errors.   Georga Hacking, MD 12/17/22 [phone]
